# Patient Record
Sex: Female | Born: 1935 | Race: White | Hispanic: No | Smoking: Never smoker
Health system: Southern US, Community
[De-identification: ages and names within clinical notes are randomized; demographics above are authoritative.]

## PROBLEM LIST (undated history)

## (undated) DIAGNOSIS — G459 Transient cerebral ischemic attack, unspecified: Secondary | ICD-10-CM

## (undated) DIAGNOSIS — N179 Acute kidney failure, unspecified: Secondary | ICD-10-CM

## (undated) DIAGNOSIS — D649 Anemia, unspecified: Secondary | ICD-10-CM

## (undated) DIAGNOSIS — K559 Vascular disorder of intestine, unspecified: Secondary | ICD-10-CM

## (undated) DIAGNOSIS — N12 Tubulo-interstitial nephritis, not specified as acute or chronic: Secondary | ICD-10-CM

## (undated) DIAGNOSIS — N261 Atrophy of kidney (terminal): Secondary | ICD-10-CM

## (undated) DIAGNOSIS — Z8719 Personal history of other diseases of the digestive system: Secondary | ICD-10-CM

## (undated) DIAGNOSIS — N189 Chronic kidney disease, unspecified: Secondary | ICD-10-CM

## (undated) DIAGNOSIS — I2510 Atherosclerotic heart disease of native coronary artery without angina pectoris: Secondary | ICD-10-CM

## (undated) DIAGNOSIS — M1990 Unspecified osteoarthritis, unspecified site: Secondary | ICD-10-CM

## (undated) DIAGNOSIS — Z860100 Personal history of colon polyps, unspecified: Secondary | ICD-10-CM

## (undated) DIAGNOSIS — I7090 Unspecified atherosclerosis: Secondary | ICD-10-CM

## (undated) DIAGNOSIS — N2889 Other specified disorders of kidney and ureter: Secondary | ICD-10-CM

## (undated) DIAGNOSIS — I3139 Other pericardial effusion (noninflammatory): Secondary | ICD-10-CM

## (undated) DIAGNOSIS — E785 Hyperlipidemia, unspecified: Secondary | ICD-10-CM

## (undated) DIAGNOSIS — I471 Supraventricular tachycardia, unspecified: Secondary | ICD-10-CM

## (undated) DIAGNOSIS — K862 Cyst of pancreas: Secondary | ICD-10-CM

## (undated) DIAGNOSIS — K315 Obstruction of duodenum: Secondary | ICD-10-CM

## (undated) DIAGNOSIS — I10 Essential (primary) hypertension: Secondary | ICD-10-CM

## (undated) DIAGNOSIS — J9811 Atelectasis: Secondary | ICD-10-CM

## (undated) DIAGNOSIS — I6523 Occlusion and stenosis of bilateral carotid arteries: Secondary | ICD-10-CM

## (undated) DIAGNOSIS — I313 Pericardial effusion (noninflammatory): Secondary | ICD-10-CM

## (undated) DIAGNOSIS — L409 Psoriasis, unspecified: Secondary | ICD-10-CM

## (undated) DIAGNOSIS — I517 Cardiomegaly: Secondary | ICD-10-CM

## (undated) DIAGNOSIS — E039 Hypothyroidism, unspecified: Secondary | ICD-10-CM

## (undated) DIAGNOSIS — C4490 Unspecified malignant neoplasm of skin, unspecified: Secondary | ICD-10-CM

## (undated) DIAGNOSIS — N1330 Unspecified hydronephrosis: Secondary | ICD-10-CM

## (undated) DIAGNOSIS — G629 Polyneuropathy, unspecified: Secondary | ICD-10-CM

## (undated) DIAGNOSIS — G2581 Restless legs syndrome: Secondary | ICD-10-CM

## (undated) DIAGNOSIS — N289 Disorder of kidney and ureter, unspecified: Secondary | ICD-10-CM

## (undated) DIAGNOSIS — K5790 Diverticulosis of intestine, part unspecified, without perforation or abscess without bleeding: Secondary | ICD-10-CM

## (undated) DIAGNOSIS — C7491 Malignant neoplasm of unspecified part of right adrenal gland: Secondary | ICD-10-CM

## (undated) DIAGNOSIS — Z973 Presence of spectacles and contact lenses: Secondary | ICD-10-CM

## (undated) DIAGNOSIS — Z8639 Personal history of other endocrine, nutritional and metabolic disease: Secondary | ICD-10-CM

## (undated) DIAGNOSIS — I739 Peripheral vascular disease, unspecified: Secondary | ICD-10-CM

## (undated) DIAGNOSIS — J189 Pneumonia, unspecified organism: Secondary | ICD-10-CM

## (undated) HISTORY — PX: THYROIDECTOMY: SHX17

## (undated) HISTORY — PX: CHOLECYSTECTOMY: SHX55

## (undated) HISTORY — PX: URETERAL STENT PLACEMENT: SHX822

## (undated) HISTORY — PX: APPENDECTOMY: SHX54

## (undated) HISTORY — PX: ADRENALECTOMY: SHX876

## (undated) HISTORY — PX: COLONOSCOPY: SHX174

## (undated) HISTORY — PX: UPPER GI ENDOSCOPY: SHX6162

---

## 1973-05-16 HISTORY — PX: ABDOMINAL HYSTERECTOMY: SHX81

## 1994-05-16 DIAGNOSIS — C7491 Malignant neoplasm of unspecified part of right adrenal gland: Secondary | ICD-10-CM

## 1994-05-16 HISTORY — DX: Malignant neoplasm of unspecified part of right adrenal gland: C74.91

## 2014-12-22 ENCOUNTER — Telehealth: Payer: Self-pay | Admitting: Gastroenterology

## 2014-12-22 NOTE — Telephone Encounter (Signed)
Received records from [facility] and have placed on Dr. Ardis Hughs desk for review.

## 2014-12-30 ENCOUNTER — Telehealth: Payer: Self-pay | Admitting: Gastroenterology

## 2014-12-30 NOTE — Telephone Encounter (Signed)
Dr. Ardis Hughs declined patient on 12-26-14

## 2015-02-06 ENCOUNTER — Ambulatory Visit: Payer: Self-pay | Admitting: Family Medicine

## 2016-11-15 HISTORY — PX: OTHER SURGICAL HISTORY: SHX169

## 2017-04-12 HISTORY — PX: RENAL ARTERY STENT: SHX2321

## 2017-04-12 HISTORY — PX: OTHER SURGICAL HISTORY: SHX169

## 2017-05-11 DIAGNOSIS — K315 Obstruction of duodenum: Secondary | ICD-10-CM

## 2017-05-11 HISTORY — DX: Obstruction of duodenum: K31.5

## 2017-11-03 ENCOUNTER — Encounter (HOSPITAL_BASED_OUTPATIENT_CLINIC_OR_DEPARTMENT_OTHER): Payer: Self-pay | Admitting: *Deleted

## 2017-11-03 ENCOUNTER — Emergency Department (HOSPITAL_BASED_OUTPATIENT_CLINIC_OR_DEPARTMENT_OTHER): Payer: Medicare Other

## 2017-11-03 ENCOUNTER — Emergency Department (HOSPITAL_BASED_OUTPATIENT_CLINIC_OR_DEPARTMENT_OTHER)
Admission: EM | Admit: 2017-11-03 | Discharge: 2017-11-03 | Disposition: A | Discharge status: Home / Self Care | Payer: Medicare Other | Attending: Emergency Medicine | Admitting: Emergency Medicine

## 2017-11-03 ENCOUNTER — Other Ambulatory Visit: Payer: Self-pay

## 2017-11-03 DIAGNOSIS — R1011 Right upper quadrant pain: Secondary | ICD-10-CM

## 2017-11-03 DIAGNOSIS — Z8673 Personal history of transient ischemic attack (TIA), and cerebral infarction without residual deficits: Secondary | ICD-10-CM | POA: Diagnosis not present

## 2017-11-03 DIAGNOSIS — Z85858 Personal history of malignant neoplasm of other endocrine glands: Secondary | ICD-10-CM | POA: Diagnosis not present

## 2017-11-03 DIAGNOSIS — N189 Chronic kidney disease, unspecified: Secondary | ICD-10-CM | POA: Insufficient documentation

## 2017-11-03 DIAGNOSIS — I2510 Atherosclerotic heart disease of native coronary artery without angina pectoris: Secondary | ICD-10-CM | POA: Diagnosis not present

## 2017-11-03 DIAGNOSIS — Z79899 Other long term (current) drug therapy: Secondary | ICD-10-CM | POA: Insufficient documentation

## 2017-11-03 DIAGNOSIS — N3000 Acute cystitis without hematuria: Secondary | ICD-10-CM | POA: Diagnosis not present

## 2017-11-03 DIAGNOSIS — Z7982 Long term (current) use of aspirin: Secondary | ICD-10-CM | POA: Insufficient documentation

## 2017-11-03 DIAGNOSIS — Z7902 Long term (current) use of antithrombotics/antiplatelets: Secondary | ICD-10-CM | POA: Diagnosis not present

## 2017-11-03 DIAGNOSIS — I517 Cardiomegaly: Secondary | ICD-10-CM

## 2017-11-03 DIAGNOSIS — I129 Hypertensive chronic kidney disease with stage 1 through stage 4 chronic kidney disease, or unspecified chronic kidney disease: Secondary | ICD-10-CM | POA: Diagnosis not present

## 2017-11-03 DIAGNOSIS — K5900 Constipation, unspecified: Secondary | ICD-10-CM | POA: Diagnosis not present

## 2017-11-03 HISTORY — DX: Chronic kidney disease, unspecified: N18.9

## 2017-11-03 HISTORY — DX: Supraventricular tachycardia: I47.1

## 2017-11-03 HISTORY — DX: Vascular disorder of intestine, unspecified: K55.9

## 2017-11-03 HISTORY — DX: Restless legs syndrome: G25.81

## 2017-11-03 HISTORY — DX: Essential (primary) hypertension: I10

## 2017-11-03 HISTORY — DX: Supraventricular tachycardia, unspecified: I47.10

## 2017-11-03 HISTORY — DX: Polyneuropathy, unspecified: G62.9

## 2017-11-03 HISTORY — DX: Atherosclerotic heart disease of native coronary artery without angina pectoris: I25.10

## 2017-11-03 HISTORY — DX: Pericardial effusion (noninflammatory): I31.3

## 2017-11-03 HISTORY — DX: Malignant neoplasm of unspecified part of right adrenal gland: C74.91

## 2017-11-03 HISTORY — DX: Hyperlipidemia, unspecified: E78.5

## 2017-11-03 HISTORY — DX: Anemia, unspecified: D64.9

## 2017-11-03 HISTORY — DX: Disorder of kidney and ureter, unspecified: N28.9

## 2017-11-03 HISTORY — DX: Other pericardial effusion (noninflammatory): I31.39

## 2017-11-03 HISTORY — DX: Cardiomegaly: I51.7

## 2017-11-03 HISTORY — DX: Transient cerebral ischemic attack, unspecified: G45.9

## 2017-11-03 HISTORY — DX: Acute kidney failure, unspecified: N17.9

## 2017-11-03 LAB — TROPONIN I: Troponin I: <0.03 ng/mL (ref <0.03)

## 2017-11-03 LAB — URINALYSIS, MICROSCOPIC (REFLEX)

## 2017-11-03 LAB — URINALYSIS, ROUTINE W REFLEX MICROSCOPIC
Bilirubin Urine: NEGATIVE
GLUCOSE, UA: NEGATIVE mg/dL
Hgb urine dipstick: NEGATIVE
KETONES UR: NEGATIVE mg/dL
Nitrite: NEGATIVE
PH: 5.5 (ref 5.0–8.0)
Protein, ur: 30 mg/dL — AB
SPECIFIC GRAVITY, URINE: 1.02 (ref 1.005–1.030)

## 2017-11-03 LAB — COMPREHENSIVE METABOLIC PANEL
ALBUMIN: 3.8 g/dL (ref 3.5–5.0)
ALT: 14 U/L (ref 14–54)
AST: 20 U/L (ref 15–41)
Alkaline Phosphatase: 61 U/L (ref 38–126)
Anion gap: 8 (ref 5–15)
BUN: 32 mg/dL — ABNORMAL HIGH (ref 6–20)
CHLORIDE: 109 mmol/L (ref 101–111)
CO2: 21 mmol/L — ABNORMAL LOW (ref 22–32)
CREATININE: 1.23 mg/dL — AB (ref 0.44–1.00)
Calcium: 8.8 mg/dL — ABNORMAL LOW (ref 8.9–10.3)
GFR calc Af Amer: 45 mL/min — ABNORMAL LOW (ref >60)
GFR, EST NON AFRICAN AMERICAN: 39 mL/min — AB (ref >60)
Glucose, Bld: 126 mg/dL — ABNORMAL HIGH (ref 65–99)
Potassium: 4.5 mmol/L (ref 3.5–5.1)
Sodium: 138 mmol/L (ref 135–145)
Total Bilirubin: 0.5 mg/dL (ref 0.3–1.2)
Total Protein: 7 g/dL (ref 6.5–8.1)

## 2017-11-03 LAB — LIPASE, BLOOD: LIPASE: 21 U/L (ref 11–51)

## 2017-11-03 LAB — CBC WITH DIFFERENTIAL/PLATELET
BASOS ABS: 0 10*3/uL (ref 0.0–0.1)
Basophils Relative: 0 %
EOS PCT: 0 %
Eosinophils Absolute: 0 10*3/uL (ref 0.0–0.7)
HEMATOCRIT: 30.9 % — AB (ref 36.0–46.0)
Hemoglobin: 10.3 g/dL — ABNORMAL LOW (ref 12.0–15.0)
LYMPHS PCT: 11 %
Lymphs Abs: 1.2 10*3/uL (ref 0.7–4.0)
MCH: 32.4 pg (ref 26.0–34.0)
MCHC: 33.3 g/dL (ref 30.0–36.0)
MCV: 97.2 fL (ref 78.0–100.0)
Monocytes Absolute: 1.4 10*3/uL — ABNORMAL HIGH (ref 0.1–1.0)
Monocytes Relative: 12 %
NEUTROS ABS: 8.7 10*3/uL — AB (ref 1.7–7.7)
Neutrophils Relative %: 77 %
PLATELETS: 132 10*3/uL — AB (ref 150–400)
RBC: 3.18 MIL/uL — AB (ref 3.87–5.11)
RDW: 13.2 % (ref 11.5–15.5)
WBC: 11.3 10*3/uL — AB (ref 4.0–10.5)

## 2017-11-03 LAB — I-STAT CG4 LACTIC ACID, ED: Lactic Acid, Venous: 1.05 mmol/L (ref 0.5–1.9)

## 2017-11-03 MED ORDER — CEPHALEXIN 500 MG PO CAPS: 500.0000 mg | ORAL_CAPSULE | Freq: Four times a day (QID) | ORAL | 0 refills | Status: AC

## 2017-11-03 MED ORDER — IOPAMIDOL (ISOVUE-300) INJECTION 61%
100.0000 mL | Freq: Once | INTRAVENOUS | Status: AC | PRN
  Administered 2017-11-03: 75 mL via INTRAVENOUS

## 2017-11-03 MED ORDER — ONDANSETRON HCL 4 MG/2ML IJ SOLN
4.0000 mg | Freq: Once | INTRAMUSCULAR | Status: AC
  Administered 2017-11-03: 4 mg via INTRAVENOUS
  Filled 2017-11-03: qty 2

## 2017-11-03 MED ORDER — POLYETHYLENE GLYCOL 3350 17 GM/SCOOP PO POWD: 1.0000 | Freq: Once | ORAL | 0 refills | Status: AC

## 2017-11-03 MED ORDER — MORPHINE SULFATE (PF) 4 MG/ML IV SOLN
4.0000 mg | Freq: Once | INTRAVENOUS | Status: AC
  Administered 2017-11-03: 4 mg via INTRAVENOUS
  Filled 2017-11-03: qty 1

## 2017-11-03 MED ORDER — SODIUM CHLORIDE 0.9 % IV SOLN
1.0000 g | Freq: Once | INTRAVENOUS | Status: AC
  Administered 2017-11-03: 1 g via INTRAVENOUS
  Filled 2017-11-03: qty 10

## 2017-11-03 MED FILL — SM CLEARLAX POWDER: 14 days supply | Qty: 238 | Fill #0

## 2017-11-03 MED FILL — CEPHALEXIN 500 MG CAPSULE: 500 | 10 days supply | Qty: 40 | Fill #0

## 2017-11-03 NOTE — Discharge Instructions (Signed)
Take antibiotics as prescribed. Take the entire course, even if your symptoms improve.  Make sure you are staying well-hydrated with water.  Your urine should be clear to pale yellow. Use Metamucil or MiraLAX for a cleanout and then 1-2 capfuls at night until you are having regular bowel movements.  You may continue your stool softener as well. It is important that you follow-up with urology (Dr. Renard Matter) for further evaluation of your symptoms and to recheck your labs. Return to the emergency room if you develop fevers, worsening pain, vomiting, inability to urinate, or any new symptoms.  You should have a very low threshold as to when to come back to the ER, as you are at much higher risk.

## 2017-11-03 NOTE — ED Provider Notes (Signed)
[facility] EMERGENCY DEPARTMENT Provider Note   CSN: [id number] Arrival date & time: 11/03/17  6599     History   Chief Complaint Chief Complaint  Patient presents with  . Abdominal Pain    HPI Brooke Walker is a 84 y.o. female presenting for evaluation of abdominal pain.  Patient states that she had acute onset abdominal pain that woke her up from sleep yesterday morning.  Since then, she has had constant right upper quadrant abdominal pain.  She was out of town at the time, which is why she waited until today to come in for evaluation.  She reports pain is severe constant, and nonradiating.  It is worse when she is lying flat, improved when she is sitting up.  She was taking ibuprofen yesterday which mildly improved the pain.  She reports nausea without vomiting.  She denies fevers, states she is feeling chilled.  She has been tolerating p.o. without difficulty and without change in the pain.  Reports normal urination without dysuria, hematuria, or worsening pain.  She has not had a bowel movement for 2 days, states this is normal.  Her last bowel movement was normal without blood or diarrhea.   Patient states her abdomen feels slightly more distended than normal. She has a significant medical history including mesenteric anemia with stent placement, pancreatic cyst, nonfunctioning right kidney, mass in the left kidney, adrenal cancer.  She has had a cholecystectomy and appendectomy.  She states she has never had pain similar to this before.  She ate cereal and a peach for breakfast this morning prior to going to urgent care.  He denies chest pain or shortness of breath, but states when she takes a deep breath then, it causes pain of the abdomen. Pt is on clopidogrel and ASA.  HPI  Past Medical History:  Diagnosis Date  . Adrenal cancer, right ([place])   . AKI (acute kidney injury) ([place])   . Anemia   . CKD (chronic kidney disease)   . Coronary artery disease   . Hyperlipidemia     . Hypertension   . Mesenteric ischemia ([place])   . Pancreatic cyst   . Pericardial effusion   . Peripheral polyneuropathy   . Renal insufficiency   . Restless leg syndrome   . SVT (supraventricular tachycardia) ([place])   . Thyroid disease   . Transient ischemic attack (TIA)     There are no active problems to display for this patient.   History reviewed. No pertinent surgical history.   OB History   None      Home Medications    Prior to Admission medications   Medication Sig Start Date End Date Taking? Authorizing Provider  amLODipine (NORVASC) 10 MG tablet Take by mouth. 08/01/12 10/02/18 Yes [provider]  clopidogrel (PLAVIX) 75 MG tablet TAKE 1 TABLET BY MOUTH EVERY DAY 08/04/17  Yes [provider]  estradiol (ESTRACE) 0.1 MG/GM vaginal cream Apply a pea-sized amount within vagina 2-3 times per week 08/04/16  Yes [provider]  hydrochlorothiazide (HYDRODIURIL) 25 MG tablet Take by mouth. 03/24/17  Yes [provider]  pravastatin (PRAVACHOL) 40 MG tablet TAKE 1 TABLET BY MOUTH EVERY DAY AT NIGHT 08/15/12  Yes [provider]  rOPINIRole (REQUIP) 0.25 MG tablet TAKE 2 TO 3 TABLETS BY MOUTH AT BEDTIME 08/15/17  Yes [provider]  aspirin EC 81 MG tablet Take by mouth.    [provider]  cephALEXin (KEFLEX) 500 MG capsule Take 1  capsule (500 mg total) by mouth 4 (four) times daily for 10 days. 11/03/17 11/13/17  Jaylyn Iyer, PA-C  diphenhydrAMINE (BENADRYL) 25 mg capsule Take by mouth.    [provider]  hydrALAZINE (APRESOLINE) 100 MG tablet Take 100 mg by mouth 3 (three) times daily. 10/14/17   [provider]  labetalol (NORMODYNE) 200 MG tablet TAKE 1/2 TABLET BY MOUTH 2 TIMES DAILY 10/02/17   [provider]  levothyroxine (SYNTHROID, LEVOTHROID) 112 MCG tablet TAKE 1 TABLET (112 MCG TOTAL) BY MOUTH DAILY AT 6AM 09/18/17   [provider]  polyethylene glycol powder (MIRALAX)  powder Take 255 g by mouth once for 1 dose. 11/03/17 11/03/17  Romulo Okray, PA-C  telmisartan (MICARDIS) 40 MG tablet Take 40 mg by mouth daily. 10/20/17   [provider]    Family History No family history on file.  Social History Social History   Tobacco Use  . Smoking status: Never Smoker  . Smokeless tobacco: Never Used  Substance Use Topics  . Alcohol use: Not on file  . Drug use: Not on file     Allergies   Amoxicillin; Clonidine derivatives; Metoprolol; and Sulfa antibiotics   Review of Systems Review of Systems  Constitutional: Positive for chills.  Gastrointestinal: Positive for abdominal pain, constipation (baseline) and nausea.  Hematological: Bruises/bleeds easily (plavix and asa).  All other systems reviewed and are negative.    Physical Exam Updated Vital Signs BP (!) 180/49 Comment: md is aware of sat, now 91% on room air.  Pulse 83 Comment: md is aware of sat, now 91% on room air.  Temp 99 F (37.2 C) (Rectal)   Resp 17 Comment: md is aware of sat, now 91% on room air.  Ht 5\' 4"  (1.626 m)   Wt 55.8 kg (123 lb)   SpO2 (!) 84% Comment: md is aware of sat, now 91% on room air.  BMI 21.11 kg/m   Physical Exam  Constitutional: She is oriented to person, place, and time. She appears well-developed and well-nourished. No distress.  Elderly female who appears uncomfortable but in no distress.  HENT:  Head: Normocephalic and atraumatic.  Eyes: Pupils are equal, round, and reactive to light. Conjunctivae and EOM are normal.  Neck: Normal range of motion. Neck supple.  Cardiovascular: Normal rate, regular rhythm and intact distal pulses.  Pulmonary/Chest: Effort normal and breath sounds normal. No respiratory distress. She has no wheezes.  Abdominal: Soft. Bowel sounds are normal. She exhibits abdominal bruit and mass. There is generalized tenderness and tenderness in the right upper quadrant. There is no rigidity, no rebound, no guarding, no CVA  tenderness and negative Murphy's sign.  Normal bowel sounds.  Bruits heard in multiple abdominal quadrants.  Tenderness to palpation of generalized abdomen, when pressing on the left side causes pain on the right upper quadrant. ?mass in RUQ/edpigastric abd.    Musculoskeletal: Normal range of motion.  Neurological: She is alert and oriented to person, place, and time.  Skin: Skin is warm and dry.  Psychiatric: She has a normal mood and affect.  Nursing note and vitals reviewed.    ED Treatments / Results  Labs (all labs ordered are listed, but only abnormal results are displayed) Labs Reviewed  CBC WITH DIFFERENTIAL/PLATELET - Abnormal; Notable for the following components:      Result Value   WBC 11.3 (*)    RBC 3.18 (*)    Hemoglobin 10.3 (*)    HCT 30.9 (*)  Platelets 132 (*)    Neutro Abs 8.7 (*)    Monocytes Absolute 1.4 (*)    All other components within normal limits  COMPREHENSIVE METABOLIC PANEL - Abnormal; Notable for the following components:   CO2 21 (*)    Glucose, Bld 126 (*)    BUN 32 (*)    Creatinine, Ser 1.23 (*)    Calcium 8.8 (*)    GFR calc non Af Amer 39 (*)    GFR calc Af Amer 45 (*)    All other components within normal limits  URINALYSIS, ROUTINE W REFLEX MICROSCOPIC - Abnormal; Notable for the following components:   APPearance CLOUDY (*)    Protein, ur 30 (*)    Leukocytes, UA LARGE (*)    All other components within normal limits  URINALYSIS, MICROSCOPIC (REFLEX) - Abnormal; Notable for the following components:   Bacteria, UA MANY (*)    All other components within normal limits  URINE CULTURE  LIPASE, BLOOD  TROPONIN I  I-STAT CG4 LACTIC ACID, ED  I-STAT CG4 LACTIC ACID, ED    EKG EKG Interpretation  Date/Time:  Friday November 03 2017 11:02:49 EDT Ventricular Rate:  81 PR Interval:    QRS Duration: 82 QT Interval:  360 QTC Calculation: 418 R Axis:   75 Text Interpretation:  Sinus rhythm Atrial premature complex Baseline wander in  lead(s) V6 No STEMI.  Confirmed by Nanda Quinton [phone]) on 11/03/2017 11:05:40 AM   Radiology Dg Chest 2 View  Result Date: 11/03/2017 CLINICAL DATA:  Right-sided abdominal pain. EXAM: CHEST - 2 VIEW COMPARISON:  No prior. FINDINGS: Mediastinum and hilar structures normal. Cardiomegaly with normal pulmonary vascularity. No focal infiltrate. Small left pleural effusion versus pleural scarring. Biapical pleural thickening noted most consistent scarring. Surgical clips upper abdomen. IMPRESSION: Cardiomegaly.  Small left pleural effusion versus pleural scarring. Electronically Signed   By: Marcello Moores  Register   On: 11/03/2017 12:06   Ct Abdomen Pelvis W Contrast  Result Date: 11/03/2017 CLINICAL DATA:  Nausea, vomiting, generalized acute abdominal pain, leukocytosis, history RIGHT adrenal gland cancer, chronic kidney disease, coronary artery disease EXAM: CT ABDOMEN AND PELVIS WITH CONTRAST TECHNIQUE: Multidetector CT imaging of the abdomen and pelvis was performed using the standard protocol following bolus administration of intravenous contrast. Sagittal and coronal MPR images reconstructed from axial data set. CONTRAST:  49mL ISOVUE-300 IOPAMIDOL (ISOVUE-300) INJECTION 61% IV. No oral contrast. COMPARISON:  None FINDINGS: Lower chest: Mild bibasilar atelectasis Hepatobiliary: Gallbladder surgically absent. Mild intrahepatic and extrahepatic biliary dilatation with CBD measuring 10 mm proximally and 8 mm distally. Pancreas: Atrophic pancreas. Cystic lesion at pancreatic tail 2.5 x 2.4 x 2.5 cm. Spleen: Normal appearance.  Small splenule at splenic hilum Adrenals/Urinary Tract: LEFT adrenal gland unremarkable. Prior resection of LEFT adrenal gland. LEFT kidney normal size with small LEFT renal cyst. Atrophic RIGHT kidney with significant hydronephrosis and minimal proximal hydroureter though the majority of the RIGHT ureters normal caliber, question proximal ureteral obstruction. No definite urinary tract  calcification is seen. Mild RIGHT perinephric edema. Single focus of air within urinary bladder question prior catheterization. Stomach/Bowel: Stomach unremarkable. Prominent stool RIGHT colon. Sigmoid diverticulosis. Remaining bowel loops unremarkable. Vascular/Lymphatic: Extensive atherosclerotic calcifications of aorta, iliac arteries and visceral artery origins. Dense calcification at origins of celiac artery, SMA and LEFT renal artery. Probable high-grade narrowings of the abdominal aorta and iliac arteries as well as proximal visceral arteries. Reproductive: Uterus surgically absent. Nonvisualization of ovaries. Other: No free air or free fluid.  No hernia.  Musculoskeletal: Diffuse osseous demineralization. Degenerative changes and scoliosis of lumbar spine. IMPRESSION: Atrophic RIGHT kidney with RIGHT hydronephrosis and proximal RIGHT hydroureter question proximal ureteral obstruction; no definite ureteral calcification is seen. Intrahepatic and extrahepatic biliary dilatation post cholecystectomy recommend correlation with LFTs. Extensive atherosclerotic calcifications with expected significant narrowings of the abdominal aorta, iliac arteries, celiac artery, SMA and LEFT renal artery. Cystic lesion at pancreatic tail 2.5 x 2.5 x 2.4 cm; if clinically indicated based on patient age and clinical status, would recommend follow-up MR imaging in 6 months to characterize and assess stability. Electronically Signed   By: Lavonia Dana M.D.   On: 11/03/2017 12:13    Procedures Procedures (including critical care time)  Medications Ordered in ED Medications  ondansetron (ZOFRAN) injection 4 mg (4 mg Intravenous Given 11/03/17 1043)  morphine 4 MG/ML injection 4 mg (4 mg Intravenous Given 11/03/17 1056)  iopamidol (ISOVUE-300) 61 % injection 100 mL (75 mLs Intravenous Contrast Given 11/03/17 1140)  cefTRIAXone (ROCEPHIN) 1 g in sodium chloride 0.9 % 100 mL IVPB (0 g Intravenous Stopped 11/03/17 1413)      Initial Impression / Assessment and Plan / ED Course  I have reviewed the triage vital signs and the nursing notes.  Pertinent labs & imaging results that were available during my care of the patient were reviewed by me and considered in my medical decision making (see chart for details).     Pt presenting for evaluation of acute onset abdominal pain.  Physical exam concerning, patient with significant intra-abdominal pathology coming in with new and worsening right upper quadrant abdominal pain.  Patient appears uncomfortable, but in no distress.  She is afebrile not tachycardic.  Generalized tenderness palpation of the abdomen, possible mass of the right upper quadrant.  Will obtain labs, lactic, urine, and CT.  Morphine and Zofran for symptom control.  Case discussed with attending, Dr. Laverta Baltimore evaluated the patient.  On reassessment, patient reports she is feeling much more comfortable.  Labs show mild leukocytosis at 11.3.  Urine shows infection with many bacteria and large leuks.  Lactic troponin negative.  Creatinine similar to baseline (1.23 vs 1.1).  Will give dose of Rocephin.  CXR viewed and interpreted by me, no pna, pnx, or effusions. ekg without stemi. CT pending.  CT shows atrophic R kidney (baseline) with R hydroureternephrosis, concerning for possible obstruction.  Vasculature appears consistent with chronic mesenteric ischemia, no obvious acute findings. Pancreatic cyst stable. Will consult with nephrology.   Discussed with nephrology from [facility], who does not seen any immediate need for admission or further work up.  Recommends follow-up with urology.  Discussed with patient.  Recommended admission for serial exams, bowel cleanout, and reassessment of symptoms.  Patient states that she is staying at a nursing home that has a doctor's nurses on staff, would like to go home.  Discussed with patient that she is at higher risk for complication.  Patient states she understands,  but would still like to go home.  She is agreeable to return if anything changes or worsens.  Patient is agreeable to follow-up with urology for further evaluation and to recheck labs.  On reexamination, patient remains comfortable and pain is controlled.  Discussed with patient diagnosis of UTI and constipation. Will d/c with abx and miralax. At this time, pt appears safe for d/c. Return precautions given. Pt states she understands and agrees to plan.    Final Clinical Impressions(s) / ED Diagnoses   Final diagnoses:  Abdominal pain, acute,  right upper quadrant  Constipation, unspecified constipation type  Acute cystitis without hematuria    ED Discharge Orders        Ordered    cephALEXin (KEFLEX) 500 MG capsule  4 times daily     11/03/17 1433    polyethylene glycol powder (MIRALAX) powder   Once     11/03/17 1433       Makailee Nudelman, PA-C 11/03/17 1753    Long, Wonda Olds, MD 11/03/17 [phone]

## 2017-11-03 NOTE — ED Triage Notes (Signed)
Pt reports right mid abd pain along with nausea, no emesis x last night. Last bm 2 days ago, states this is her normal bowel pattern. Denies any fevers, went to urgent care and was sent here.

## 2017-11-06 LAB — URINE CULTURE

## 2017-11-07 ENCOUNTER — Telehealth: Payer: Self-pay | Admitting: Emergency Medicine

## 2017-11-07 NOTE — Telephone Encounter (Signed)
Post ED Visit - Positive Culture Follow-up  Culture report reviewed by antimicrobial stewardship pharmacist:  []  Elenor Quinones, Pharm.D. []  Heide Guile, Pharm.D., BCPS AQ-ID []  Parks Neptune, Pharm.D., BCPS []  Alycia Rossetti, Pharm.D., BCPS []  [place], [place].D., BCPS, AAHIVP []  Legrand Como, Pharm.D., BCPS, AAHIVP []  Salome Arnt, PharmD, BCPS []  Wynell Balloon, PharmD []  Vincenza Hews, PharmD, BCPS Maxcine Ham PharmD  Positive urine culture Treated with cephalexin, organism sensitive to the same and no further patient follow-up is required at this time.  Hazle Nordmann 11/07/2017, 12:28 PM

## 2017-11-15 ENCOUNTER — Other Ambulatory Visit: Payer: Self-pay

## 2017-11-15 ENCOUNTER — Encounter (HOSPITAL_BASED_OUTPATIENT_CLINIC_OR_DEPARTMENT_OTHER): Payer: Self-pay

## 2017-11-15 ENCOUNTER — Emergency Department (HOSPITAL_BASED_OUTPATIENT_CLINIC_OR_DEPARTMENT_OTHER)
Admission: EM | Admit: 2017-11-15 | Discharge: 2017-11-15 | Disposition: A | Discharge status: Home / Self Care | Payer: Medicare Other | Attending: Emergency Medicine | Admitting: Emergency Medicine

## 2017-11-15 DIAGNOSIS — Z7982 Long term (current) use of aspirin: Secondary | ICD-10-CM | POA: Diagnosis not present

## 2017-11-15 DIAGNOSIS — I2510 Atherosclerotic heart disease of native coronary artery without angina pectoris: Secondary | ICD-10-CM | POA: Insufficient documentation

## 2017-11-15 DIAGNOSIS — E079 Disorder of thyroid, unspecified: Secondary | ICD-10-CM | POA: Diagnosis not present

## 2017-11-15 DIAGNOSIS — N189 Chronic kidney disease, unspecified: Secondary | ICD-10-CM | POA: Diagnosis not present

## 2017-11-15 DIAGNOSIS — Z8673 Personal history of transient ischemic attack (TIA), and cerebral infarction without residual deficits: Secondary | ICD-10-CM | POA: Diagnosis not present

## 2017-11-15 DIAGNOSIS — R21 Rash and other nonspecific skin eruption: Secondary | ICD-10-CM | POA: Insufficient documentation

## 2017-11-15 DIAGNOSIS — Z79899 Other long term (current) drug therapy: Secondary | ICD-10-CM | POA: Diagnosis not present

## 2017-11-15 DIAGNOSIS — I129 Hypertensive chronic kidney disease with stage 1 through stage 4 chronic kidney disease, or unspecified chronic kidney disease: Secondary | ICD-10-CM | POA: Diagnosis not present

## 2017-11-15 DIAGNOSIS — Z7902 Long term (current) use of antithrombotics/antiplatelets: Secondary | ICD-10-CM | POA: Diagnosis not present

## 2017-11-15 MED ORDER — KETOCONAZOLE 2 % EX SHAM: 1.0000 "application " | MEDICATED_SHAMPOO | CUTANEOUS | 0 refills | Status: AC

## 2017-11-15 MED FILL — KETOCONAZOLE 2% SHAMPOO: 2 | 14 days supply | Qty: 120 | Fill #0

## 2017-11-15 NOTE — ED Provider Notes (Signed)
[facility] EMERGENCY DEPARTMENT Provider Note   CSN: [id number] Arrival date & time: 11/15/17  1158     History   Chief Complaint Chief Complaint  Patient presents with  . Rash    HPI Brooke Walker is a 84 y.o. female.  The history is provided by the patient. No language interpreter was used.  Rash     Brooke Walker is a 84 y.o. female who presents to the Emergency Department complaining of rash. Four weeks ago she developed an itchy rash to the back of her left ear. She saw her dermatologist three weeks ago and was started on mupirocin ointment. Over the last three weeks her rash has progressed and now she has itching throughout the left side of her scalp. There is no pain, fevers or visual changes. She saw her hairstylist today who noted hair loss and she presents to the emergency department for further evaluation. Past Medical History:  Diagnosis Date  . Adrenal cancer, right ([place])   . AKI (acute kidney injury) ([place])   . Anemia   . CKD (chronic kidney disease)   . Coronary artery disease   . Hyperlipidemia   . Hypertension   . Mesenteric ischemia ([place])   . Pancreatic cyst   . Pericardial effusion   . Peripheral polyneuropathy   . Renal insufficiency   . Restless leg syndrome   . SVT (supraventricular tachycardia) ([place])   . Thyroid disease   . Transient ischemic attack (TIA)     There are no active problems to display for this patient.   History reviewed. No pertinent surgical history.   OB History   None      Home Medications    Prior to Admission medications   Medication Sig Start Date End Date Taking? Authorizing Provider  amLODipine (NORVASC) 10 MG tablet Take by mouth. 08/01/12 10/02/18  [provider]  aspirin EC 81 MG tablet Take by mouth.    [provider]  clopidogrel (PLAVIX) 75 MG tablet TAKE 1 TABLET BY MOUTH EVERY DAY 08/04/17   [provider]  diphenhydrAMINE (BENADRYL) 25 mg capsule Take by mouth.     [provider]  estradiol (ESTRACE) 0.1 MG/GM vaginal cream Apply a pea-sized amount within vagina 2-3 times per week 08/04/16   [provider]  hydrALAZINE (APRESOLINE) 100 MG tablet Take 100 mg by mouth 3 (three) times daily. 10/14/17   [provider]  hydrochlorothiazide (HYDRODIURIL) 25 MG tablet Take by mouth. 03/24/17   [provider]  ketoconazole (NIZORAL) 2 % shampoo Apply 1 application topically 3 (three) times a week. Use three times weekly for two weeks. 11/15/17   Quintella Reichert, MD  labetalol (NORMODYNE) 200 MG tablet TAKE 1/2 TABLET BY MOUTH 2 TIMES DAILY 10/02/17   [provider]  levothyroxine (SYNTHROID, LEVOTHROID) 112 MCG tablet TAKE 1 TABLET (112 MCG TOTAL) BY MOUTH DAILY AT 6AM 09/18/17   [provider]  pravastatin (PRAVACHOL) 40 MG tablet TAKE 1 TABLET BY MOUTH EVERY DAY AT NIGHT 08/15/12   [provider]  rOPINIRole (REQUIP) 0.25 MG tablet TAKE 2 TO 3 TABLETS BY MOUTH AT BEDTIME 08/15/17   [provider]  telmisartan (MICARDIS) 40 MG tablet Take 40 mg by mouth daily. 10/20/17   [provider]    Family History No family history on file.  Social History Social History   Tobacco Use  . Smoking status: Never Smoker  . Smokeless tobacco: Never Used  Substance Use Topics  .  Alcohol use: Never    Frequency: Never  . Drug use: Never     Allergies   Amoxicillin; Clonidine derivatives; Metoprolol; and Sulfa antibiotics   Review of Systems Review of Systems  Skin: Positive for rash.  All other systems reviewed and are negative.    Physical Exam Updated Vital Signs BP (!) 193/49 (BP Location: Left Arm)   Pulse 82   Temp 98 F (36.7 C) (Oral)   Resp 18   Ht 5\' 4"  (1.626 m)   Wt 55.1 kg (121 lb 7.6 oz)   SpO2 98%   BMI 20.85 kg/m   Physical Exam  Constitutional: She is oriented to person, place, and time. She appears well-developed and well-nourished.  HENT:  Head:  Normocephalic and atraumatic.  Right TM obscured by cerumen. Left TM clear. Pupils equal round and reactive. EOM I. There is a rash over the left scalp and behind the left ear. Rash consists of multiple large plaques that are ovoid in shape with a pink center, with central hair loss. Surrounding the plaques are areas of scaling and flaking. There is one plaque posterior to the left ear and one plaque on the left forehead.  Cardiovascular: Regular rhythm.  Pulmonary/Chest: Effort normal. No respiratory distress.  Musculoskeletal: She exhibits no edema or tenderness.  Neurological: She is alert and oriented to person, place, and time.  Skin: Skin is warm and dry.  Psychiatric: She has a normal mood and affect. Her behavior is normal.  Nursing note and vitals reviewed.    ED Treatments / Results  Labs (all labs ordered are listed, but only abnormal results are displayed) Labs Reviewed - No data to display  EKG None  Radiology No results found.  Procedures Procedures (including critical care time)  Medications Ordered in ED Medications - No data to display   Initial Impression / Assessment and Plan / ED Course  I have reviewed the triage vital signs and the nursing notes.  Pertinent labs & imaging results that were available during my care of the patient were reviewed by me and considered in my medical decision making (see chart for details).     Patient here for evaluation of four weeks of itchy rash to scalp. She does have multiple plaques, no evidence of zoster. No evidence of impetigo, cellulitis, abscess. Question possible tinea capitus or fungal scalp infection versus seborrheic dermatitis versus autoimmune condition. Will treat with ketoconazole shampoo with outpatient dermatology follow-up and return precautions.  Final Clinical Impressions(s) / ED Diagnoses   Final diagnoses:  Rash    ED Discharge Orders        Ordered    ketoconazole (NIZORAL) 2 % shampoo  3 times  weekly     11/15/17 1405       Quintella Reichert, MD 11/15/17 [phone]

## 2017-11-15 NOTE — Discharge Instructions (Signed)
You may have a fungal infection on your scalp called tinea capitus. Please use the shampoo as prescribed. Follow-up with your dermatologist and one week. Get rechecked immediately if you develop severe pain, fevers, vision changes or new concerning symptoms. You may take Benadryl at home as needed for itching according to label instructions.

## 2017-11-15 NOTE — ED Notes (Signed)
ED Provider at bedside. 

## 2017-11-15 NOTE — ED Triage Notes (Signed)
C/o rash that started behind left ear and now to scalp-was dermatologist 6/22-rx ointment-unsure of dx-NAD-steady gait

## 2017-12-14 DIAGNOSIS — J9811 Atelectasis: Secondary | ICD-10-CM

## 2017-12-14 HISTORY — DX: Atelectasis: J98.11

## 2017-12-20 ENCOUNTER — Inpatient Hospital Stay (HOSPITAL_BASED_OUTPATIENT_CLINIC_OR_DEPARTMENT_OTHER)
Admission: EM | Admit: 2017-12-20 | Discharge: 2017-12-25 | DRG: 659 | Disposition: A | Discharge status: Skilled Nursing Facility | Payer: Medicare Other | Attending: Internal Medicine | Admitting: Internal Medicine

## 2017-12-20 ENCOUNTER — Other Ambulatory Visit: Payer: Self-pay

## 2017-12-20 ENCOUNTER — Emergency Department (HOSPITAL_BASED_OUTPATIENT_CLINIC_OR_DEPARTMENT_OTHER): Payer: Medicare Other

## 2017-12-20 ENCOUNTER — Encounter (HOSPITAL_BASED_OUTPATIENT_CLINIC_OR_DEPARTMENT_OTHER): Payer: Self-pay | Admitting: Emergency Medicine

## 2017-12-20 DIAGNOSIS — R109 Unspecified abdominal pain: Secondary | ICD-10-CM | POA: Diagnosis not present

## 2017-12-20 DIAGNOSIS — N2889 Other specified disorders of kidney and ureter: Secondary | ICD-10-CM

## 2017-12-20 DIAGNOSIS — Z85858 Personal history of malignant neoplasm of other endocrine glands: Secondary | ICD-10-CM

## 2017-12-20 DIAGNOSIS — E039 Hypothyroidism, unspecified: Secondary | ICD-10-CM | POA: Diagnosis present

## 2017-12-20 DIAGNOSIS — I2510 Atherosclerotic heart disease of native coronary artery without angina pectoris: Secondary | ICD-10-CM | POA: Diagnosis not present

## 2017-12-20 DIAGNOSIS — Z7902 Long term (current) use of antithrombotics/antiplatelets: Secondary | ICD-10-CM

## 2017-12-20 DIAGNOSIS — K862 Cyst of pancreas: Secondary | ICD-10-CM | POA: Diagnosis present

## 2017-12-20 DIAGNOSIS — D509 Iron deficiency anemia, unspecified: Secondary | ICD-10-CM | POA: Diagnosis not present

## 2017-12-20 DIAGNOSIS — I161 Hypertensive emergency: Secondary | ICD-10-CM | POA: Diagnosis present

## 2017-12-20 DIAGNOSIS — Z66 Do not resuscitate: Secondary | ICD-10-CM | POA: Diagnosis not present

## 2017-12-20 DIAGNOSIS — I959 Hypotension, unspecified: Secondary | ICD-10-CM | POA: Diagnosis not present

## 2017-12-20 DIAGNOSIS — N136 Pyonephrosis: Principal | ICD-10-CM | POA: Diagnosis present

## 2017-12-20 DIAGNOSIS — D696 Thrombocytopenia, unspecified: Secondary | ICD-10-CM | POA: Diagnosis not present

## 2017-12-20 DIAGNOSIS — E875 Hyperkalemia: Secondary | ICD-10-CM | POA: Diagnosis present

## 2017-12-20 DIAGNOSIS — E785 Hyperlipidemia, unspecified: Secondary | ICD-10-CM | POA: Diagnosis not present

## 2017-12-20 DIAGNOSIS — Z87440 Personal history of urinary (tract) infections: Secondary | ICD-10-CM

## 2017-12-20 DIAGNOSIS — Z7982 Long term (current) use of aspirin: Secondary | ICD-10-CM

## 2017-12-20 DIAGNOSIS — G2581 Restless legs syndrome: Secondary | ICD-10-CM | POA: Diagnosis present

## 2017-12-20 DIAGNOSIS — Z8673 Personal history of transient ischemic attack (TIA), and cerebral infarction without residual deficits: Secondary | ICD-10-CM

## 2017-12-20 DIAGNOSIS — G629 Polyneuropathy, unspecified: Secondary | ICD-10-CM | POA: Diagnosis present

## 2017-12-20 DIAGNOSIS — J9601 Acute respiratory failure with hypoxia: Secondary | ICD-10-CM | POA: Diagnosis not present

## 2017-12-20 DIAGNOSIS — L409 Psoriasis, unspecified: Secondary | ICD-10-CM | POA: Diagnosis not present

## 2017-12-20 DIAGNOSIS — I1310 Hypertensive heart and chronic kidney disease without heart failure, with stage 1 through stage 4 chronic kidney disease, or unspecified chronic kidney disease: Secondary | ICD-10-CM | POA: Diagnosis not present

## 2017-12-20 DIAGNOSIS — Z882 Allergy status to sulfonamides status: Secondary | ICD-10-CM

## 2017-12-20 DIAGNOSIS — Z90710 Acquired absence of both cervix and uterus: Secondary | ICD-10-CM

## 2017-12-20 DIAGNOSIS — R739 Hyperglycemia, unspecified: Secondary | ICD-10-CM | POA: Diagnosis not present

## 2017-12-20 DIAGNOSIS — N12 Tubulo-interstitial nephritis, not specified as acute or chronic: Secondary | ICD-10-CM | POA: Diagnosis present

## 2017-12-20 DIAGNOSIS — I313 Pericardial effusion (noninflammatory): Secondary | ICD-10-CM | POA: Diagnosis present

## 2017-12-20 DIAGNOSIS — I499 Cardiac arrhythmia, unspecified: Secondary | ICD-10-CM | POA: Diagnosis present

## 2017-12-20 DIAGNOSIS — J811 Chronic pulmonary edema: Secondary | ICD-10-CM | POA: Diagnosis present

## 2017-12-20 DIAGNOSIS — N1330 Unspecified hydronephrosis: Secondary | ICD-10-CM | POA: Diagnosis present

## 2017-12-20 DIAGNOSIS — Z888 Allergy status to other drugs, medicaments and biological substances status: Secondary | ICD-10-CM

## 2017-12-20 DIAGNOSIS — N3592 Unspecified urethral stricture, female: Secondary | ICD-10-CM | POA: Diagnosis not present

## 2017-12-20 DIAGNOSIS — Z880 Allergy status to penicillin: Secondary | ICD-10-CM

## 2017-12-20 DIAGNOSIS — Z923 Personal history of irradiation: Secondary | ICD-10-CM

## 2017-12-20 DIAGNOSIS — Z79899 Other long term (current) drug therapy: Secondary | ICD-10-CM

## 2017-12-20 DIAGNOSIS — R0902 Hypoxemia: Secondary | ICD-10-CM

## 2017-12-20 DIAGNOSIS — N183 Chronic kidney disease, stage 3 (moderate): Secondary | ICD-10-CM | POA: Diagnosis not present

## 2017-12-20 DIAGNOSIS — Z79890 Hormone replacement therapy (postmenopausal): Secondary | ICD-10-CM

## 2017-12-20 DIAGNOSIS — I10 Essential (primary) hypertension: Secondary | ICD-10-CM | POA: Diagnosis present

## 2017-12-20 HISTORY — DX: Cyst of pancreas: K86.2

## 2017-12-20 HISTORY — DX: Other specified disorders of kidney and ureter: N28.89

## 2017-12-20 HISTORY — DX: Psoriasis, unspecified: L40.9

## 2017-12-20 LAB — CBC WITH DIFFERENTIAL/PLATELET
Basophils Absolute: 0 10*3/uL (ref 0.0–0.1)
Basophils Relative: 0 %
Eosinophils Absolute: 0 10*3/uL (ref 0.0–0.7)
Eosinophils Relative: 0 %
HCT: 34.9 % — ABNORMAL LOW (ref 36.0–46.0)
Hemoglobin: 11.7 g/dL — ABNORMAL LOW (ref 12.0–15.0)
Lymphocytes Relative: 2 %
Lymphs Abs: 0.4 10*3/uL — ABNORMAL LOW (ref 0.7–4.0)
MCH: 32.1 pg (ref 26.0–34.0)
MCHC: 33.5 g/dL (ref 30.0–36.0)
MCV: 95.6 fL (ref 78.0–100.0)
Monocytes Absolute: 1.5 10*3/uL — ABNORMAL HIGH (ref 0.1–1.0)
Monocytes Relative: 7 %
Neutro Abs: 20 10*3/uL — ABNORMAL HIGH (ref 1.7–7.7)
Neutrophils Relative %: 91 %
Platelets: 166 10*3/uL (ref 150–400)
RBC: 3.65 MIL/uL — ABNORMAL LOW (ref 3.87–5.11)
RDW: 13.5 % (ref 11.5–15.5)
WBC: 21.9 10*3/uL — ABNORMAL HIGH (ref 4.0–10.5)

## 2017-12-20 LAB — URINALYSIS, ROUTINE W REFLEX MICROSCOPIC
Bilirubin Urine: NEGATIVE
Glucose, UA: NEGATIVE mg/dL
Hgb urine dipstick: NEGATIVE
Ketones, ur: NEGATIVE mg/dL
Leukocytes, UA: NEGATIVE
Nitrite: POSITIVE — AB
Protein, ur: 100 mg/dL — AB
Specific Gravity, Urine: 1.01 (ref 1.005–1.030)
pH: 6 (ref 5.0–8.0)

## 2017-12-20 LAB — COMPREHENSIVE METABOLIC PANEL
ALT: 23 U/L (ref 0–44)
AST: 39 U/L (ref 15–41)
Albumin: 4.3 g/dL (ref 3.5–5.0)
Alkaline Phosphatase: 67 U/L (ref 38–126)
Anion gap: 11 (ref 5–15)
BUN: 30 mg/dL — ABNORMAL HIGH (ref 8–23)
CO2: 19 mmol/L — ABNORMAL LOW (ref 22–32)
Calcium: 9.3 mg/dL (ref 8.9–10.3)
Chloride: 107 mmol/L (ref 98–111)
Creatinine, Ser: 1.04 mg/dL — ABNORMAL HIGH (ref 0.44–1.00)
GFR calc Af Amer: 56 mL/min — ABNORMAL LOW (ref >60)
GFR calc non Af Amer: 48 mL/min — ABNORMAL LOW (ref >60)
Glucose, Bld: 164 mg/dL — ABNORMAL HIGH (ref 70–99)
Potassium: 4.3 mmol/L (ref 3.5–5.1)
Sodium: 137 mmol/L (ref 135–145)
Total Bilirubin: 0.7 mg/dL (ref 0.3–1.2)
Total Protein: 7.5 g/dL (ref 6.5–8.1)

## 2017-12-20 LAB — URINALYSIS, MICROSCOPIC (REFLEX): WBC, UA: >50 WBC/hpf (ref 0–5)

## 2017-12-20 LAB — T4, FREE: Free T4: 0.81 ng/dL — ABNORMAL LOW (ref 0.82–1.77)

## 2017-12-20 LAB — LIPASE, BLOOD: Lipase: 22 U/L (ref 11–51)

## 2017-12-20 LAB — TSH: TSH: 6.861 u[IU]/mL — ABNORMAL HIGH (ref 0.350–4.500)

## 2017-12-20 MED ORDER — SODIUM CHLORIDE 0.9 % IV SOLN
1.0000 g | INTRAVENOUS | Status: DC
  Administered 2017-12-20 – 2017-12-24 (×5): 1 g via INTRAVENOUS
  Filled 2017-12-20 (×5): qty 1
  Filled 2017-12-20: qty 10

## 2017-12-20 MED ORDER — CLOPIDOGREL BISULFATE 75 MG PO TABS
75.0000 mg | ORAL_TABLET | Freq: Every day | ORAL | Status: DC
  Administered 2017-12-21 – 2017-12-25 (×4): 75 mg via ORAL
  Filled 2017-12-20 (×6): qty 1

## 2017-12-20 MED ORDER — ROPINIROLE HCL 1 MG PO TABS: 0.5000 mg | ORAL_TABLET | Freq: Every day | ORAL | Status: DC

## 2017-12-20 MED ORDER — CIPROFLOXACIN IN D5W 400 MG/200ML IV SOLN
400.0000 mg | Freq: Once | INTRAVENOUS | Status: AC
  Administered 2017-12-20: 400 mg via INTRAVENOUS
  Filled 2017-12-20: qty 200

## 2017-12-20 MED ORDER — SODIUM CHLORIDE 0.9 % IV SOLN
INTRAVENOUS | Status: DC
  Administered 2017-12-20 – 2017-12-21 (×4): via INTRAVENOUS

## 2017-12-20 MED ORDER — ONDANSETRON HCL 4 MG/2ML IJ SOLN
4.0000 mg | Freq: Four times a day (QID) | INTRAMUSCULAR | Status: DC | PRN
  Administered 2017-12-20 – 2017-12-21 (×4): 4 mg via INTRAVENOUS
  Filled 2017-12-20 (×4): qty 2

## 2017-12-20 MED ORDER — ONDANSETRON HCL 4 MG/2ML IJ SOLN
4.0000 mg | Freq: Once | INTRAMUSCULAR | Status: AC
  Administered 2017-12-20: 4 mg via INTRAVENOUS

## 2017-12-20 MED ORDER — LEVOTHYROXINE SODIUM 112 MCG PO TABS
112.0000 ug | ORAL_TABLET | Freq: Every day | ORAL | Status: DC
Start: 2017-12-21 — End: 2017-12-25
  Administered 2017-12-21 – 2017-12-25 (×4): 112 ug via ORAL
  Filled 2017-12-20 (×5): qty 1

## 2017-12-20 MED ORDER — IOPAMIDOL (ISOVUE-300) INJECTION 61%
100.0000 mL | Freq: Once | INTRAVENOUS | Status: AC | PRN
  Administered 2017-12-20: 100 mL via INTRAVENOUS

## 2017-12-20 MED ORDER — ONDANSETRON HCL 4 MG PO TABS: 4.0000 mg | ORAL_TABLET | Freq: Four times a day (QID) | ORAL | Status: DC | PRN

## 2017-12-20 MED ORDER — PRAVASTATIN SODIUM 20 MG PO TABS
40.0000 mg | ORAL_TABLET | Freq: Every day | ORAL | Status: DC
  Administered 2017-12-20 – 2017-12-25 (×6): 40 mg via ORAL
  Filled 2017-12-20 (×3): qty 2
  Filled 2017-12-20: qty 1
  Filled 2017-12-20 (×2): qty 2

## 2017-12-20 MED ORDER — ACETAMINOPHEN 650 MG RE SUPP: 650.0000 mg | Freq: Four times a day (QID) | RECTAL | Status: DC | PRN

## 2017-12-20 MED ORDER — ONDANSETRON HCL 4 MG/2ML IJ SOLN
INTRAMUSCULAR | Status: AC
  Administered 2017-12-20: 4 mg via INTRAVENOUS
  Filled 2017-12-20: qty 2

## 2017-12-20 MED ORDER — MORPHINE SULFATE (PF) 4 MG/ML IV SOLN
4.0000 mg | Freq: Once | INTRAVENOUS | Status: AC | PRN
  Administered 2017-12-20: 4 mg via INTRAVENOUS
  Filled 2017-12-20 (×2): qty 1

## 2017-12-20 MED ORDER — DOCUSATE SODIUM 100 MG PO CAPS
100.0000 mg | ORAL_CAPSULE | Freq: Two times a day (BID) | ORAL | Status: DC
  Administered 2017-12-21 – 2017-12-25 (×7): 100 mg via ORAL
  Filled 2017-12-20 (×8): qty 1

## 2017-12-20 MED ORDER — HYDRALAZINE HCL 50 MG PO TABS
100.0000 mg | ORAL_TABLET | Freq: Three times a day (TID) | ORAL | Status: DC
  Administered 2017-12-20 – 2017-12-25 (×14): 100 mg via ORAL
  Filled 2017-12-20 (×14): qty 2

## 2017-12-20 MED ORDER — ONDANSETRON HCL 4 MG/2ML IJ SOLN
4.0000 mg | Freq: Once | INTRAMUSCULAR | Status: AC
Start: 2017-12-20 — End: 2017-12-20
  Administered 2017-12-20: 4 mg via INTRAVENOUS
  Filled 2017-12-20 (×2): qty 2

## 2017-12-20 MED ORDER — ACETAMINOPHEN 325 MG PO TABS
650.0000 mg | ORAL_TABLET | Freq: Four times a day (QID) | ORAL | Status: DC | PRN
  Administered 2017-12-22 – 2017-12-25 (×5): 650 mg via ORAL
  Filled 2017-12-20 (×5): qty 2

## 2017-12-20 MED ORDER — MORPHINE SULFATE (PF) 4 MG/ML IV SOLN
6.0000 mg | Freq: Once | INTRAVENOUS | Status: AC
  Administered 2017-12-20: 6 mg via INTRAVENOUS
  Filled 2017-12-20: qty 2

## 2017-12-20 MED ORDER — ONDANSETRON HCL 4 MG/2ML IJ SOLN
4.0000 mg | Freq: Four times a day (QID) | INTRAMUSCULAR | Status: DC | PRN
  Administered 2017-12-20: 4 mg via INTRAVENOUS
  Filled 2017-12-20: qty 2

## 2017-12-20 MED ORDER — ASPIRIN EC 81 MG PO TBEC
81.0000 mg | DELAYED_RELEASE_TABLET | Freq: Every day | ORAL | Status: DC
  Administered 2017-12-23 – 2017-12-25 (×3): 81 mg via ORAL
  Filled 2017-12-20 (×3): qty 1

## 2017-12-20 MED ORDER — ENOXAPARIN SODIUM 40 MG/0.4ML ~~LOC~~ SOLN
40.0000 mg | SUBCUTANEOUS | Status: DC
  Administered 2017-12-20: 40 mg via SUBCUTANEOUS
  Filled 2017-12-20: qty 0.4

## 2017-12-20 MED ORDER — LABETALOL HCL 100 MG PO TABS: 100.0000 mg | ORAL_TABLET | Freq: Two times a day (BID) | ORAL | Status: DC

## 2017-12-20 MED ORDER — MORPHINE SULFATE (PF) 2 MG/ML IV SOLN
2.0000 mg | INTRAVENOUS | Status: DC | PRN
  Administered 2017-12-20 – 2017-12-21 (×8): 2 mg via INTRAVENOUS
  Filled 2017-12-20 (×8): qty 1

## 2017-12-20 MED ORDER — HYDRALAZINE HCL 20 MG/ML IJ SOLN
5.0000 mg | INTRAMUSCULAR | Status: DC | PRN
  Administered 2017-12-20 – 2017-12-21 (×5): 5 mg via INTRAVENOUS
  Filled 2017-12-20 (×5): qty 1

## 2017-12-20 MED ORDER — AMLODIPINE BESYLATE 10 MG PO TABS
10.0000 mg | ORAL_TABLET | Freq: Every day | ORAL | Status: DC
  Administered 2017-12-21 – 2017-12-25 (×5): 10 mg via ORAL
  Filled 2017-12-20 (×5): qty 1

## 2017-12-20 NOTE — ED Notes (Signed)
Patient refuses pain and nausea medication at present.  Will alert staff if she feels she needs medication

## 2017-12-20 NOTE — Progress Notes (Signed)
Patient is an 84 yo with h/o TIA; mesenteric ischemia; HNT; HLD; CAD; and R adrenal cancer presenting to [facility] with abdominal pain.  Patient with R flank and lower abdominal pain, n/v.  UA is c/w UTI.  WBC 20k.  CT with severe R hydronephrosis.  Has chronic atrophy after remote treatment for adrenal CA.  However, she did not have prior hydro.  Dr. Alinda Money from urology suggests outpatient consult.  She is still having pain and unable to tolerate PO meds.  Renal function is at baseline.  Anticipate short hospital stay so Obs status is appropriate.  Does not need telemetry.  Carlyon Shadow, M.D.

## 2017-12-20 NOTE — ED Notes (Signed)
ED Provider at bedside. 

## 2017-12-20 NOTE — ED Provider Notes (Signed)
[facility] EMERGENCY DEPARTMENT Provider Note   CSN: [id number] Arrival date & time: 12/20/17  8309     History   Chief Complaint Chief Complaint  Patient presents with  . Abdominal Pain    HPI Brooke Walker is a 84 y.o. female.  HPI  Past Medical History:  Diagnosis Date  . Adrenal cancer, right ([place])   . AKI (acute kidney injury) ([place])   . Anemia   . CKD (chronic kidney disease)   . Coronary artery disease   . Hyperlipidemia   . Hypertension   . Mesenteric ischemia ([place])   . Pancreatic cyst   . Pericardial effusion   . Peripheral polyneuropathy   . Renal insufficiency   . Restless leg syndrome   . SVT (supraventricular tachycardia) ([place])   . Thyroid disease   . Transient ischemic attack (TIA)     There are no active problems to display for this patient.   History reviewed. No pertinent surgical history.   OB History   None      Home Medications    Prior to Admission medications   Medication Sig Start Date End Date Taking? Authorizing Provider  Apremilast (OTEZLA PO) Take by mouth.   Yes [provider]  amLODipine (NORVASC) 10 MG tablet Take by mouth. 08/01/12 10/02/18  [provider]  aspirin EC 81 MG tablet Take by mouth.    [provider]  clopidogrel (PLAVIX) 75 MG tablet TAKE 1 TABLET BY MOUTH EVERY DAY 08/04/17   [provider]  diphenhydrAMINE (BENADRYL) 25 mg capsule Take by mouth.    [provider]  estradiol (ESTRACE) 0.1 MG/GM vaginal cream Apply a pea-sized amount within vagina 2-3 times per week 08/04/16   [provider]  hydrALAZINE (APRESOLINE) 100 MG tablet Take 100 mg by mouth 3 (three) times daily. 10/14/17   [provider]  hydrochlorothiazide (HYDRODIURIL) 25 MG tablet Take by mouth. 03/24/17   [provider]  ketoconazole (NIZORAL) 2 % shampoo Apply 1 application topically 3 (three) times a week. Use three times weekly for two weeks. 11/15/17   Quintella Reichert, MD  labetalol (NORMODYNE) 200 MG tablet TAKE 1/2 TABLET BY MOUTH 2 TIMES DAILY 10/02/17   [provider]  levothyroxine (SYNTHROID, LEVOTHROID) 112 MCG tablet TAKE 1 TABLET (112 MCG TOTAL) BY MOUTH DAILY AT 6AM 09/18/17   [provider]  pravastatin (PRAVACHOL) 40 MG tablet TAKE 1 TABLET BY MOUTH EVERY DAY AT NIGHT 08/15/12   [provider]  rOPINIRole (REQUIP) 0.25 MG tablet TAKE 2 TO 3 TABLETS BY MOUTH AT BEDTIME 08/15/17   [provider]  telmisartan (MICARDIS) 40 MG tablet Take 40 mg by mouth daily. 10/20/17   [provider]    Family History History reviewed. No pertinent family history.  Social History Social History   Tobacco Use  . Smoking status: Never Smoker  . Smokeless tobacco: Never Used  Substance Use Topics  . Alcohol use: Never    Frequency: Never  . Drug use: Never     Allergies   Amoxicillin; Clonidine derivatives; Metoprolol; and Sulfa antibiotics   Review of Systems Review of Systems  All systems reviewed and negative, other than as noted in HPI.  Physical Exam Updated Vital Signs BP (!) 173/45   Pulse 82   Temp 98.2 F (36.8 C) (Oral)   Resp 16   Ht 5\' 4"  (1.626 m)   Wt 54.4 kg (120 lb)   SpO2 95%  BMI 20.60 kg/m   Physical Exam  Constitutional: She appears well-developed and well-nourished.  Laying in bed with eyes closed. Looks really uncomfortable.   HENT:  Head: Normocephalic and atraumatic.  Eyes: Conjunctivae are normal. Right eye exhibits no discharge. Left eye exhibits no discharge.  Neck: Neck supple.  Cardiovascular: Normal rate, regular rhythm and normal heart sounds. Exam reveals no gallop and no friction rub.  No murmur heard. Pulmonary/Chest: Effort normal and breath sounds normal. No respiratory distress.  Abdominal: Soft. She exhibits no distension. There is no tenderness.  CVA tenderness.  Tenderness to the right flank, right lower quadrant and suprapubically.  No  rebound or guarding.  Musculoskeletal: She exhibits no edema or tenderness.  Neurological: She is alert.  Skin: Skin is warm and dry.  Psychiatric: She has a normal mood and affect. Her behavior is normal. Thought content normal.  Nursing note and vitals reviewed.    ED Treatments / Results  Labs (all labs ordered are listed, but only abnormal results are displayed) Labs Reviewed  URINALYSIS, ROUTINE W REFLEX MICROSCOPIC - Abnormal; Notable for the following components:      Result Value   APPearance HAZY (*)    Protein, ur 100 (*)    Nitrite POSITIVE (*)    All other components within normal limits  URINALYSIS, MICROSCOPIC (REFLEX) - Abnormal; Notable for the following components:   Bacteria, UA MANY (*)    All other components within normal limits  CBC WITH DIFFERENTIAL/PLATELET - Abnormal; Notable for the following components:   WBC 21.9 (*)    RBC 3.65 (*)    Hemoglobin 11.7 (*)    HCT 34.9 (*)    Neutro Abs 20.0 (*)    Lymphs Abs 0.4 (*)    Monocytes Absolute 1.5 (*)    All other components within normal limits  COMPREHENSIVE METABOLIC PANEL - Abnormal; Notable for the following components:   CO2 19 (*)    Glucose, Bld 164 (*)    BUN 30 (*)    Creatinine, Ser 1.04 (*)    GFR calc non Af Amer 48 (*)    GFR calc Af Amer 56 (*)    All other components within normal limits  URINE CULTURE  LIPASE, BLOOD    EKG None  Radiology Ct Abdomen Pelvis W Contrast  Result Date: 12/20/2017 CLINICAL DATA:  Abdominal pain, nausea vomiting since yesterday EXAM: CT ABDOMEN AND PELVIS WITH CONTRAST TECHNIQUE: Multidetector CT imaging of the abdomen and pelvis was performed using the standard protocol following bolus administration of intravenous contrast. CONTRAST:  137mL ISOVUE-300 IOPAMIDOL (ISOVUE-300) INJECTION 61% COMPARISON:  November 03 2017 FINDINGS: Lower chest: Mild atelectasis of bilateral lung bases are noted. The heart size is mildly enlarged. There is a small pericardial  effusion. Hepatobiliary: No focal liver lesion is identified. Patient status post prior cholecystectomy. Intra and extrahepatic biliary ductal dilatation are identified unchanged. Pancreas: There is a 2.2 x 2.6 cm cyst in the tail of the pancreas. A second 0.8 cm cyst is identified in the tail of the pancreas likely present on the prior CT. The pancreas is otherwise unremarkable. Spleen: Normal in size without focal abnormality. Adrenals/Urinary Tract: The left adrenal gland is normal. There is a 1.5 x 1.8 cm mixed cystic and solid enhancing mass in the anterior midpole left kidney. There is no left hydronephrosis. The patient status post prior right adrenal resection. There is atrophic right kidney with significant hydronephrosis and minimal proximal hydroureter though the majority of the right  ureter is normal caliber. This is unchanged compared prior CT. There is right perinephric edema. There are 2 focus of air within the bladder lumen question recent instrumentation. Stomach/Bowel: The stomach is normal. There is no small bowel obstruction. There is mild diffuse bowel wall thickening of the left-sided transverse colon which is under distended. There is diverticulosis of colon. The appendix is not seen but no inflammation is noted surrounding the cecum. Vascular/Lymphatic: Marked aortic atherosclerosis. No enlarged abdominal or pelvic lymph nodes. Reproductive: Status post hysterectomy. No adnexal masses. Other: None. Musculoskeletal: Degenerative joint changes of the spine are noted. IMPRESSION: Diffuse bowel wall thickening of the left side transverse colon. This can be seen in colitis. No small bowel obstruction. Mixed cystic and solid enhancing mass in the midpole left kidney. Renal cell carcinoma not excluded. Further evaluation MR of kidneys is recommended. Cysts in the tail of pancreas not significantly changed compared to prior CT. Atrophic right kidney with significant right hydronephrosis and minimal  proximal hydroureter not significantly changed compared prior CT. Electronically Signed   By: Abelardo Diesel M.D.   On: 12/20/2017 11:29    Procedures Procedures (including critical care time)  Medications Ordered in ED Medications  0.9 %  sodium chloride infusion ( Intravenous New Bag/Given 12/20/17 1006)  morphine 4 MG/ML injection 6 mg (6 mg Intravenous Given 12/20/17 1002)  ciprofloxacin (CIPRO) IVPB 400 mg (400 mg Intravenous New Bag/Given 12/20/17 1008)  ondansetron (ZOFRAN) injection 4 mg (4 mg Intravenous Given 12/20/17 1202)  morphine 4 MG/ML injection 4 mg (4 mg Intravenous Given 12/20/17 1206)  iopamidol (ISOVUE-300) 61 % injection 100 mL (100 mLs Intravenous Contrast Given 12/20/17 1058)     Initial Impression / Assessment and Plan / ED Course  I have reviewed the triage vital signs and the nursing notes.  Pertinent labs & imaging results that were available during my care of the patient were reviewed by me and considered in my medical decision making (see chart for details).  Clinical Course as of Dec 20 1244  Wed Dec 20, 2017  1043 Pain better. Still nauseated. Zofran ordered and also another dose of morphine PRN and she was encouraged to let us know if she feels like she needs it. UA/labs reviewed with pt and daughter. CT pending.    [SK]    Clinical Course User Index [SK] Virgel Manifold, MD    84 year old female with right flank pain.  UA is consistent with UTI.  She was seen approximately 2 months ago with similar symptoms.  CT at that time showed right hydronephrosis without obvious source of obstruction.  UA was consistent with UTI then as well.    She has a distant history of adrenal cancer and she was treated with resection and radiation.  She may potentially have some degree of scarring/stricture from this or from recurrent infections.  We will treat her symptoms.  Ciprofloxacin based on her allergies and susceptibility of last urine culture.  Given her degree of pain will  image again to further evaluate for other potential etiologies.  Imaging as above. Again with significant hydronephrosis. Additional abnormalities noted, but not that I suspect are contributory to current presentation. No mention of R hydronephrosis on CT a/p in June/July 2016 or 12/2016 on imaging done through [facility] has had prior nephrology evaluations but not urology. Discussed briefly with Dr Alinda Money, urology. Would treat as pyelonephritis at this point. With new recurrent or persistent hydronephrosis, would have this further evaluated. Without fever though and  baseline renal function this can be done on outpatient basis. Per review of prior labs through [facility],  baseline Cr seems to be ~0.90-1 with GFR ~50-60.  Pt/daughter updated. Pt feeling increasingly nauseated again and more pain. Requiring more meds. With her age, may be more prudent to admit for ongoing treatment until she can reliably tolerate PO meds.     Final Clinical Impressions(s) / ED Diagnoses   Final diagnoses:  Pyelonephritis  Hydronephrosis of right kidney    ED Discharge Orders    None       Virgel Manifold, MD 12/20/17 1253

## 2017-12-20 NOTE — ED Notes (Signed)
Oxygen saturation dropped to 85% on RA.  Placed on 2L oxygen via [place]

## 2017-12-20 NOTE — ED Triage Notes (Signed)
Reports right lower quadrant pain since last night.  Seen previously for same approximately 6 weeks ago and diagnosed with a "kidney infection".  C/o nausea and vomiting without diarrhea.  Denies dysuria, hematuria.

## 2017-12-20 NOTE — H&P (Signed)
History and Physical    Brooke Walker HCW:[id number] DOB: May 12, 1931 DOA: 12/20/2017  PCP: [name] I, MD Consultants:  [facility] - nephrology [facility]); [facility] - referred, has appt 8/27, also nephrology; [facility] - vascular; [facility] - cardiology; Jerene Pitch - GI Patient coming from:  [facility]; NOK: Daughter-in-law - [phone]  Chief Complaint: R flank pain  HPI: Brooke Walker is a 84 y.o. female with medical history significant of TIA; mesenteric ischemia; HTN; HLD; CAD; and R adrenal cancer presenting with right flank pain.  She has been having extreme pain along the right flank.  The pain started about 10pm while she was getting ready for bed.  The pain kept her up all night and she vomited 3 times.  This [place]  She went to the doctor after she got home and they thought they were some inflammation and the ureter was blocked.  It settled down some but seemed to flare again.  They did not see nephrology after the June hospitalization but she does have an appointment later this month.  She did see her vascular surgeon, who said that the stents appeared to be open.  She is having significant pain.   ED Course: Patient with R flank and lower abdominal pain, n/v.  UA is c/w UTI.  WBC 20k.  CT with severe R hydronephrosis.  Has chronic atrophy after remote treatment for adrenal CA.  However, she did not have prior hydro.  Dr. Alinda Money from urology suggests outpatient consult.  She is still having pain and unable to tolerate PO meds.  Renal function is at baseline.  Anticipate short hospital stay so Obs status is appropriate.  Does not need telemetry.    Review of Systems: As per HPI; otherwise review of systems reviewed and negative.   Ambulatory Status:  Ambulates without assistance  Past Medical History:  Diagnosis Date  . Adrenal cancer, right ([place])   . AKI (acute kidney injury) ([place])   . Anemia   . CKD (chronic kidney  disease)   . Coronary artery disease   . Hyperlipidemia   . Hypertension   . Mesenteric ischemia ([place])   . Pancreatic cyst   . Pericardial effusion   . Peripheral polyneuropathy   . Psoriasis   . Renal insufficiency   . Restless leg syndrome   . SVT (supraventricular tachycardia) ([place])   . Thyroid disease   . Transient ischemic attack (TIA)     Past Surgical History:  Procedure Laterality Date  . ABDOMINAL HYSTERECTOMY  1975  . ADRENALECTOMY Right    with radiation  . mesenteric ischemia stents    . URETERAL STENT PLACEMENT      Social History   Socioeconomic History  . Marital status: Widowed    Spouse name: Not on file  . Number of children: Not on file  . Years of education: Not on file  . Highest education level: Not on file  Occupational History  . Occupation: retired  Scientific laboratory technician  . Financial resource strain: Not on file  . Food insecurity:    Worry: Not on file    Inability: Not on file  . Transportation needs:    Medical: Not on file    Non-medical: Not on file  Tobacco Use  . Smoking status: Never Smoker  . Smokeless tobacco: Never Used  Substance and Sexual Activity  . Alcohol use: Never    Frequency: Never  . Drug use:  Never  . Sexual activity: Not on file  Lifestyle  . Physical activity:    Days per week: Not on file    Minutes per session: Not on file  . Stress: Not on file  Relationships  . Social connections:    Talks on phone: Not on file    Gets together: Not on file    Attends religious service: Not on file    Active member of club or organization: Not on file    Attends meetings of clubs or organizations: Not on file    Relationship status: Not on file  . Intimate partner violence:    Fear of current or ex partner: Not on file    Emotionally abused: Not on file    Physically abused: Not on file    Forced sexual activity: Not on file  Other Topics Concern  . Not on file  Social History Narrative  . Not on file    Allergies    Allergen Reactions  . Amoxicillin   . Clonidine Derivatives   . Metoprolol   . Sulfa Antibiotics     History reviewed. No pertinent family history.  Prior to Admission medications   Medication Sig Start Date End Date Taking? Authorizing Provider  Apremilast (OTEZLA PO) Take by mouth.   Yes [provider]  amLODipine (NORVASC) 10 MG tablet Take by mouth. 08/01/12 10/02/18  [provider]  aspirin EC 81 MG tablet Take by mouth.    [provider]  clopidogrel (PLAVIX) 75 MG tablet TAKE 1 TABLET BY MOUTH EVERY DAY 08/04/17   [provider]  diphenhydrAMINE (BENADRYL) 25 mg capsule Take by mouth.    [provider]  estradiol (ESTRACE) 0.1 MG/GM vaginal cream Apply a pea-sized amount within vagina 2-3 times per week 08/04/16   [provider]  hydrALAZINE (APRESOLINE) 100 MG tablet Take 100 mg by mouth 3 (three) times daily. 10/14/17   [provider]  hydrochlorothiazide (HYDRODIURIL) 25 MG tablet Take by mouth. 03/24/17   [provider]  ketoconazole (NIZORAL) 2 % shampoo Apply 1 application topically 3 (three) times a week. Use three times weekly for two weeks. 11/15/17   Quintella Reichert, MD  labetalol (NORMODYNE) 200 MG tablet TAKE 1/2 TABLET BY MOUTH 2 TIMES DAILY 10/02/17   [provider]  levothyroxine (SYNTHROID, LEVOTHROID) 112 MCG tablet TAKE 1 TABLET (112 MCG TOTAL) BY MOUTH DAILY AT 6AM 09/18/17   [provider]  pravastatin (PRAVACHOL) 40 MG tablet TAKE 1 TABLET BY MOUTH EVERY DAY AT NIGHT 08/15/12   [provider]  rOPINIRole (REQUIP) 0.25 MG tablet TAKE 2 TO 3 TABLETS BY MOUTH AT BEDTIME 08/15/17   [provider]  telmisartan (MICARDIS) 40 MG tablet Take 40 mg by mouth daily. 10/20/17   [provider]    Physical Exam: Vitals:   12/20/17 1300 12/20/17 1330 12/20/17 1449 12/20/17 1454  BP: (!) 175/50 (!) 179/55 (!) 208/62   Pulse: 73 74 78 82  Resp: 16  13 18   Temp:    97.9 F (36.6 C)   TempSrc:   Oral   SpO2: 94% 91% (!) 84% 92%  Weight:      Height:         General:  Appears calm but somewhat uncomfortable with right renal colic pain Eyes:   EOMI, normal lids, iris ENT:  Hard of hearing, normal lips & tongue, mmm Neck:  no LAD, masses or thyromegaly Cardiovascular:  Irregularly irregular rate/rhythm -  possible sinus arrhythmia, no m/r/g. No LE edema.  Respiratory:   CTA bilaterally with no wheezes/rales/rhonchi.  Normal respiratory effort. Abdomen:  soft, TTP along right flank/RLQ, ND, NABS Skin:  no rash or induration seen on limited exam Musculoskeletal:  grossly normal tone BUE/BLE, good ROM, no bony abnormality Lower extremity:  No LE edema.  Limited foot exam with no ulcerations.  2+ distal pulses. Psychiatric:  grossly normal mood and affect, speech fluent and appropriate, AOx3 Neurologic:  CN 2-12 grossly intact, moves all extremities in coordinated fashion, sensation intact    Radiological Exams on Admission: Ct Abdomen Pelvis W Contrast  Result Date: 12/20/2017 CLINICAL DATA:  Abdominal pain, nausea vomiting since yesterday EXAM: CT ABDOMEN AND PELVIS WITH CONTRAST TECHNIQUE: Multidetector CT imaging of the abdomen and pelvis was performed using the standard protocol following bolus administration of intravenous contrast. CONTRAST:  159mL ISOVUE-300 IOPAMIDOL (ISOVUE-300) INJECTION 61% COMPARISON:  November 03 2017 FINDINGS: Lower chest: Mild atelectasis of bilateral lung bases are noted. The heart size is mildly enlarged. There is a small pericardial effusion. Hepatobiliary: No focal liver lesion is identified. Patient status post prior cholecystectomy. Intra and extrahepatic biliary ductal dilatation are identified unchanged. Pancreas: There is a 2.2 x 2.6 cm cyst in the tail of the pancreas. A second 0.8 cm cyst is identified in the tail of the pancreas likely present on the prior CT. The pancreas is otherwise unremarkable. Spleen: Normal in  size without focal abnormality. Adrenals/Urinary Tract: The left adrenal gland is normal. There is a 1.5 x 1.8 cm mixed cystic and solid enhancing mass in the anterior midpole left kidney. There is no left hydronephrosis. The patient status post prior right adrenal resection. There is atrophic right kidney with significant hydronephrosis and minimal proximal hydroureter though the majority of the right ureter is normal caliber. This is unchanged compared prior CT. There is right perinephric edema. There are 2 focus of air within the bladder lumen question recent instrumentation. Stomach/Bowel: The stomach is normal. There is no small bowel obstruction. There is mild diffuse bowel wall thickening of the left-sided transverse colon which is under distended. There is diverticulosis of colon. The appendix is not seen but no inflammation is noted surrounding the cecum. Vascular/Lymphatic: Marked aortic atherosclerosis. No enlarged abdominal or pelvic lymph nodes. Reproductive: Status post hysterectomy. No adnexal masses. Other: None. Musculoskeletal: Degenerative joint changes of the spine are noted. IMPRESSION: Diffuse bowel wall thickening of the left side transverse colon. This can be seen in colitis. No small bowel obstruction. Mixed cystic and solid enhancing mass in the midpole left kidney. Renal cell carcinoma not excluded. Further evaluation MR of kidneys is recommended. Cysts in the tail of pancreas not significantly changed compared to prior CT. Atrophic right kidney with significant right hydronephrosis and minimal proximal hydroureter not significantly changed compared prior CT. Electronically Signed   By: Abelardo Diesel M.D.   On: 12/20/2017 11:29    EKG:  pending  Labs on Admission: I have personally reviewed the available labs and imaging studies at the time of the admission.  Pertinent labs:   CO2 19; 21 in 6/21 Glucose 164 BUN 30/Creatinine 1.04/GFR 48 - improved from 6/21 CMP otherwise  WNL WBC 21.9 Hgb 11.7; improved from 6/21 UA: negative LE, positive nitrite, 100 protein, many bacteria Urine culture is pending Prior urine culture from 6/21 with Klebsiella, resistant to ampicillin and nitrofurantoin  Assessment/Plan Principal Problem:   Pyelonephritis Active Problems:   Hyperglycemia   Essential hypertension   Left  renal mass   Hydronephrosis of right kidney   Arrhythmia   Pancreatic cyst   Pyelonephritis with hydronephrosis, renal atrophy -Patient with h/o similar presentation in June -Marked right flank pain -Denies urinary symptoms otherwise -Imaging shows right renal atrophy with significant hydronephrosis, generally unchanged from June -ER PA called her nephrologist from [facility] in June and he recommended serial exams, bowel cleanout, and reassessment of symptoms; the patient chose outpatient care and was given Cipro and Miralax -Dr. Alinda Money from urology consulted this admission and recommends observation with treatment for pyelo and plan for outpatient evaluation of recurrent/persistent hydronephrosis -Will observe for now -IVF -Rocephin for empiric treatment of UTI while awaiting cultures -Pain control with morphine/nausea control with Zofran -If not improving, may need inpatient urology consultation  Left renal mass -There appears to be a new left renal mixed cystic and solid mass, possibly renal cell CA -Consider inpatient vs. Outpatient MRI  Pancreatic cyst -Appears stable on CT  HTN -Continue Norvasc, Lopressor, and PO hydralazine -Provide IV prn hydralazine -Hold HCTZ and ACE at this time -Continue ASA and Plavix for ASCVD  Hyperglycemia -May be stress response -Will follow with fasting AM labs -It is unlikely that she will need acute or chronic treatment for this issue   Hypothyroidism -Check TSH and free T4 -Continue Synthroid at current dose for now   DVT prophylaxis: Lovenox  Code Status: DNR - confirmed with  patient/family Family Communication: Daughter and daughter-in-law present throughout evaluation Disposition Plan:  Home once clinically improved Consults called: Urology by telephone  Admission status: It is my clinical opinion that referral for OBSERVATION is reasonable and necessary in this patient based on the above information provided. The aforementioned taken together are felt to place the patient at high risk for further clinical deterioration. However it is anticipated that the patient may be medically stable for discharge from the hospital within 24 to 48 hours.    Karmen Bongo MD [facility]  If note is complete, please contact covering daytime or nighttime physician. [email or web address] Password [email or web address]  12/20/2017, 6:01 PM

## 2017-12-21 ENCOUNTER — Observation Stay (HOSPITAL_COMMUNITY): Payer: Medicare Other

## 2017-12-21 DIAGNOSIS — D509 Iron deficiency anemia, unspecified: Secondary | ICD-10-CM | POA: Diagnosis present

## 2017-12-21 DIAGNOSIS — N2889 Other specified disorders of kidney and ureter: Secondary | ICD-10-CM

## 2017-12-21 DIAGNOSIS — K862 Cyst of pancreas: Secondary | ICD-10-CM

## 2017-12-21 DIAGNOSIS — N1330 Unspecified hydronephrosis: Secondary | ICD-10-CM

## 2017-12-21 DIAGNOSIS — I1310 Hypertensive heart and chronic kidney disease without heart failure, with stage 1 through stage 4 chronic kidney disease, or unspecified chronic kidney disease: Secondary | ICD-10-CM | POA: Diagnosis present

## 2017-12-21 DIAGNOSIS — I959 Hypotension, unspecified: Secondary | ICD-10-CM | POA: Diagnosis not present

## 2017-12-21 DIAGNOSIS — N12 Tubulo-interstitial nephritis, not specified as acute or chronic: Secondary | ICD-10-CM | POA: Diagnosis not present

## 2017-12-21 DIAGNOSIS — G629 Polyneuropathy, unspecified: Secondary | ICD-10-CM | POA: Diagnosis present

## 2017-12-21 DIAGNOSIS — E039 Hypothyroidism, unspecified: Secondary | ICD-10-CM | POA: Diagnosis present

## 2017-12-21 DIAGNOSIS — I10 Essential (primary) hypertension: Secondary | ICD-10-CM

## 2017-12-21 DIAGNOSIS — R739 Hyperglycemia, unspecified: Secondary | ICD-10-CM

## 2017-12-21 DIAGNOSIS — E785 Hyperlipidemia, unspecified: Secondary | ICD-10-CM | POA: Diagnosis present

## 2017-12-21 DIAGNOSIS — R109 Unspecified abdominal pain: Secondary | ICD-10-CM | POA: Diagnosis present

## 2017-12-21 DIAGNOSIS — I340 Nonrheumatic mitral (valve) insufficiency: Secondary | ICD-10-CM | POA: Diagnosis not present

## 2017-12-21 DIAGNOSIS — Z66 Do not resuscitate: Secondary | ICD-10-CM | POA: Diagnosis present

## 2017-12-21 DIAGNOSIS — G2581 Restless legs syndrome: Secondary | ICD-10-CM | POA: Diagnosis present

## 2017-12-21 DIAGNOSIS — N3592 Unspecified urethral stricture, female: Secondary | ICD-10-CM | POA: Diagnosis present

## 2017-12-21 DIAGNOSIS — J9601 Acute respiratory failure with hypoxia: Secondary | ICD-10-CM | POA: Diagnosis present

## 2017-12-21 DIAGNOSIS — I161 Hypertensive emergency: Secondary | ICD-10-CM | POA: Diagnosis present

## 2017-12-21 DIAGNOSIS — I2510 Atherosclerotic heart disease of native coronary artery without angina pectoris: Secondary | ICD-10-CM | POA: Diagnosis present

## 2017-12-21 DIAGNOSIS — N183 Chronic kidney disease, stage 3 (moderate): Secondary | ICD-10-CM | POA: Diagnosis present

## 2017-12-21 DIAGNOSIS — E875 Hyperkalemia: Secondary | ICD-10-CM | POA: Diagnosis present

## 2017-12-21 DIAGNOSIS — N136 Pyonephrosis: Secondary | ICD-10-CM | POA: Diagnosis present

## 2017-12-21 DIAGNOSIS — J811 Chronic pulmonary edema: Secondary | ICD-10-CM | POA: Diagnosis present

## 2017-12-21 DIAGNOSIS — D696 Thrombocytopenia, unspecified: Secondary | ICD-10-CM | POA: Diagnosis present

## 2017-12-21 DIAGNOSIS — I313 Pericardial effusion (noninflammatory): Secondary | ICD-10-CM | POA: Diagnosis present

## 2017-12-21 DIAGNOSIS — Z85858 Personal history of malignant neoplasm of other endocrine glands: Secondary | ICD-10-CM | POA: Diagnosis not present

## 2017-12-21 DIAGNOSIS — L409 Psoriasis, unspecified: Secondary | ICD-10-CM | POA: Diagnosis present

## 2017-12-21 LAB — BASIC METABOLIC PANEL
ANION GAP: 7 (ref 5–15)
Anion gap: 7 (ref 5–15)
BUN: 23 mg/dL (ref 8–23)
BUN: 23 mg/dL (ref 8–23)
CALCIUM: 8.8 mg/dL — AB (ref 8.9–10.3)
CO2: 25 mmol/L (ref 22–32)
CO2: 24 mmol/L (ref 22–32)
CREATININE: 1.26 mg/dL — AB (ref 0.44–1.00)
Calcium: 8.9 mg/dL (ref 8.9–10.3)
Chloride: 112 mmol/L — ABNORMAL HIGH (ref 98–111)
Chloride: 108 mmol/L (ref 98–111)
Creatinine, Ser: 1.2 mg/dL — ABNORMAL HIGH (ref 0.44–1.00)
GFR, EST AFRICAN AMERICAN: 44 mL/min — AB (ref >60)
GFR, EST AFRICAN AMERICAN: 47 mL/min — AB (ref >60)
GFR, EST NON AFRICAN AMERICAN: 38 mL/min — AB (ref >60)
GFR, EST NON AFRICAN AMERICAN: 40 mL/min — AB (ref >60)
Glucose, Bld: 118 mg/dL — ABNORMAL HIGH (ref 70–99)
Glucose, Bld: 131 mg/dL — ABNORMAL HIGH (ref 70–99)
POTASSIUM: 6.1 mmol/L — AB (ref 3.5–5.1)
POTASSIUM: 4.9 mmol/L (ref 3.5–5.1)
SODIUM: 144 mmol/L (ref 135–145)
Sodium: 139 mmol/L (ref 135–145)

## 2017-12-21 LAB — CBC
HCT: 33.7 % — ABNORMAL LOW (ref 36.0–46.0)
Hemoglobin: 10.6 g/dL — ABNORMAL LOW (ref 12.0–15.0)
MCH: 31.1 pg (ref 26.0–34.0)
MCHC: 31.5 g/dL (ref 30.0–36.0)
MCV: 98.8 fL (ref 78.0–100.0)
PLATELETS: 145 10*3/uL — AB (ref 150–400)
RBC: 3.41 MIL/uL — AB (ref 3.87–5.11)
RDW: 14.3 % (ref 11.5–15.5)
WBC: 22 10*3/uL — AB (ref 4.0–10.5)

## 2017-12-21 LAB — BLOOD GAS, ARTERIAL
ACID-BASE DEFICIT: 4.1 mmol/L — AB (ref 0.0–2.0)
BICARBONATE: 20.6 mmol/L (ref 20.0–28.0)
Drawn by: 358491
FIO2: 60
O2 SAT: 93.9 %
PATIENT TEMPERATURE: 98.6
pCO2 arterial: 38.5 mmHg (ref 32.0–48.0)
pH, Arterial: 7.348 — ABNORMAL LOW (ref 7.350–7.450)
pO2, Arterial: 70.5 mmHg — ABNORMAL LOW (ref 83.0–108.0)

## 2017-12-21 LAB — MRSA PCR SCREENING: MRSA by PCR: NEGATIVE

## 2017-12-21 MED ORDER — HYDRALAZINE HCL 20 MG/ML IJ SOLN
10.0000 mg | Freq: Four times a day (QID) | INTRAMUSCULAR | Status: DC | PRN
  Administered 2017-12-21 – 2017-12-23 (×5): 10 mg via INTRAVENOUS
  Filled 2017-12-21 (×6): qty 1

## 2017-12-21 MED ORDER — FUROSEMIDE 10 MG/ML IJ SOLN
40.0000 mg | Freq: Once | INTRAMUSCULAR | Status: AC
  Administered 2017-12-21: 40 mg via INTRAVENOUS
  Filled 2017-12-21: qty 4

## 2017-12-21 MED ORDER — SODIUM POLYSTYRENE SULFONATE 15 GM/60ML PO SUSP
30.0000 g | Freq: Once | ORAL | Status: AC
  Administered 2017-12-21: 30 g via ORAL
  Filled 2017-12-21: qty 120

## 2017-12-21 MED ORDER — ENOXAPARIN SODIUM 30 MG/0.3ML ~~LOC~~ SOLN
30.0000 mg | SUBCUTANEOUS | Status: DC
  Administered 2017-12-21 – 2017-12-24 (×4): 30 mg via SUBCUTANEOUS
  Filled 2017-12-21 (×4): qty 0.3

## 2017-12-21 MED ORDER — SODIUM CHLORIDE 0.9 % IV SOLN
1.0000 g | Freq: Once | INTRAVENOUS | Status: AC
  Administered 2017-12-21: 1 g via INTRAVENOUS
  Filled 2017-12-21: qty 10

## 2017-12-21 NOTE — Consult Note (Signed)
Urology Consult  CC: Referring physician: Irene Pap, DO Reason for referral: Right flank pain  Impression/Assessment:  1.  Acute right flank pain: The exact etiology is unclear at this point but it is obvious that she has minimal renal function on that side and what appears to be long-standing hydronephrosis due to some form of obstruction that has yet to be determined.  Her right flank pain is improving.  At this point because it is improving I do not feel any form of urgent intervention is necessary.  2.  Right hydronephrosis: She has severe right hydronephrosis with loss of renal parenchyma indicating long-standing obstruction.  This should be evaluated for the source at some point however my suspicion is with previous radiation to her kidney retrograde access may not be attainable.  We did discuss both retrograde stenting and percutaneous nephrostomy tube placement should she demonstrate clinical signs of sepsis or clinical deterioration but at this time she is afebrile, has no tachycardia and no hypotension.  Her pain is improving.  She does however continue to maintain an elevated white blood cell count.  My thought is that if she did experience clinical deterioration of some form a nephrostomy tube would be the most likely means of decompressing the system as the cause of her obstruction is unknown and retrograde access may not be attainable.  If her system is drained there is a risk associated with eventual removal of the drain as this is a closed system and if bacteria are introduced she would almost certainly develop a significant infection and the alternative of maintaining drainage of her system with a stent in a system that is nonfunctioning would not be in her best interest either.  I have discussed this with the patient and her two daughters today at length.  3.  Left renal lesion: She has a small left renal lesion that is currently being followed by her oncologist at [facility].   The current plan is surveillance according to the patient.  4.  Possible UTI: While the patient has had multiple UTIs she is familiar with the symptoms and was not experiencing any of these.  She did however have many bacteria in her urine specimen with >50 white blood cells seen.  A urine culture is pending.  She is currently on Rocephin.   Plan:  1.  At this time I did not recommend any form of drainage of her right kidney. 2.  Continue IV antibiotics. 3.  Await urine culture results. 4.  She will eventually need to undergo some form of procedure to evaluate the cause of her obstruction.    History of Present Illness: Brooke Walker is a very pleasant 84 year old female seen in hospital consultation today for further evaluation of right flank pain and hydronephrosis.  She has a rather complex past urologic history that began approximately 33 years ago when she was diagnosed with some form of adrenal cancer that she was treated for at [facility] where they gave her radiation to her adrenal gland and kidney and did an adrenalectomy.  She was told at the time that radiation to the kidney would likely cause damage to the kidney.   Over the years she said she has had difficulty with recurrent UTIs.  The symptoms she experiences with her UTIs are those of dysuria, frequency and urgency and by her account she says she has had over 55 UTIs. 6-7 weeks ago she experienced right flank pain and pain in the right upper quadrant.  She was seen in the emergency room where she was diagnosed with a UTI and placed on Keflex.  It took some time for the Keflex to result in resolution of her flank pain but eventually it seemed to do so although she feels that her flank pain may have persisted to a very slight degree since that time. 48 hours ago she developed what she describes as extremely severe right upper quadrant pain with eventual radiation into the flank.  It was constant and not relieved by positional change.  It was not  associated with any hematuria.  She also reports that she had no symptoms of UTI as she is quite familiar with those symptoms.  She underwent a CT scan which revealed marked right hydronephrosis with no ureteral stone identified and significant loss of renal parenchyma indicating long-standing obstruction.  A CT scan in 6/19 revealed a similar degree of hydronephrosis and according to her daughters she apparently had a CT scan elsewhere in 12/18 that they say did not indicate the presence of significant hydronephrosis although those images are unavailable.  Since being admitted to the hospital her pain has diminished markedly although she still has some slight dull aching in the right flank.  She has not experienced any hematuria. In addition she has a lesion that has been identified in the left kidney about 1.5 years ago and is being followed by Dr. Gwynneth Macleod, her oncologist at [facility], who has recommended observation only due to its small size and the fact that she has essentially only a single renal unit.  The fact that she has been known to have no significant function on the right-hand side at least a year and a half ago indicates that she probably has not had any significant renal function on that side for many years.  Past Medical History:  Diagnosis Date  . Adrenal cancer, right ([place])   . AKI (acute kidney injury) ([place])   . Anemia   . CKD (chronic kidney disease)   . Coronary artery disease   . Hyperlipidemia   . Hypertension   . Mesenteric ischemia ([place])   . Pancreatic cyst   . Pericardial effusion   . Peripheral polyneuropathy   . Psoriasis   . Renal insufficiency   . Restless leg syndrome   . SVT (supraventricular tachycardia) ([place])   . Thyroid disease   . Transient ischemic attack (TIA)    Past Surgical History:  Procedure Laterality Date  . ABDOMINAL HYSTERECTOMY  1975  . ADRENALECTOMY Right    with radiation  . mesenteric ischemia stents    . URETERAL STENT PLACEMENT       Medications:  Scheduled: . amLODipine  10 mg Oral Daily  . aspirin EC  81 mg Oral Daily  . clopidogrel  75 mg Oral Q breakfast  . docusate sodium  100 mg Oral BID  . enoxaparin (LOVENOX) injection  30 mg Subcutaneous Q24H  . hydrALAZINE  100 mg Oral Q8H  . levothyroxine  112 mcg Oral QAC breakfast  . pravastatin  40 mg Oral q1800   Continuous: . cefTRIAXone (ROCEPHIN)  IV Stopped (12/20/17 2104)    Allergies:  Allergies  Allergen Reactions  . Amoxicillin     Other reaction(s): Other (See Comments) Liver infection per pt report Caused liver infection   . Clonidine Anaphylaxis  . Sulfa Antibiotics Nausea Only  . Sulfamethoxazole Nausea And Vomiting  . Clonidine Derivatives   . Metoprolol     Other reaction(s): Dizziness (intolerance) syncope  History reviewed. No pertinent family history.  Social History:  reports that she has never smoked. She has never used smokeless tobacco. She reports that she does not drink alcohol or use drugs.  Review of Systems (10 point): Pertinent items are noted in HPI. A comprehensive review of systems was negative except as noted above.  Physical Exam:  Vital signs in last 24 hours: Temp:  [98.3 F (36.8 C)-99.7 F (37.6 C)] 99.3 F (37.4 C) (08/08 1411) Pulse Rate:  [80-99] 99 (08/08 1421) Resp:  [16-20] 20 (08/08 0504) BP: (184-235)/(54-108) 235/72 (08/08 1715) SpO2:  [82 %-97 %] 82 % (08/08 1421) General appearance: alert and appears stated age Head: Normocephalic, without obvious abnormality, atraumatic Eyes: conjunctivae/corneas clear. EOM's intact.  Oropharynx: moist mucous membranes Neck: supple, symmetrical, trachea midline Resp: normal respiratory effort Cardio: regular rate and rhythm Back: symmetric, no curvature. ROM normal.  Minimal CVA tenderness. GI: soft, she is tender in the right upper quadrant; bowel sounds normal; no masses,  no organomegaly Extremities: extremities normal, atraumatic, no cyanosis or  edema Skin: Skin color normal. No visible rashes or lesions Neurologic: Grossly normal  Laboratory Data:  Recent Labs    12/20/17 0913 12/21/17 0424  WBC 21.9* 22.0*  HGB 11.7* 10.6*  HCT 34.9* 33.7*   BMET Recent Labs    12/21/17 0424 12/21/17 1344  NA 144 139  K 6.1* 4.9  CL 112* 108  CO2 25 24  GLUCOSE 118* 131*  BUN 23 23  CREATININE 1.26* 1.20*  CALCIUM 8.9 8.8*   No results for input(s): LABPT, INR in the last 72 hours. No results for input(s): LABURIN in the last 72 hours. Results for orders placed or performed during the hospital encounter of 11/03/17  Urine culture     Status: Abnormal   Collection Time: 11/03/17 12:02 PM  Result Value Ref Range Status   Specimen Description   Final    URINE, CLEAN CATCH Performed at [facility], [address] [place],  [address]    Special Requests   Final    NONE Performed at [facility], [address] [place], [place] [address]    Culture >=100,000 COLONIES/mL KLEBSIELLA PNEUMONIAE (A)  Final   Report Status 11/06/2017 FINAL  Final   Organism ID, Bacteria KLEBSIELLA PNEUMONIAE (A)  Final      Susceptibility   Klebsiella pneumoniae - MIC*    AMPICILLIN RESISTANT Resistant     CEFAZOLIN <=4 SENSITIVE Sensitive     CEFTRIAXONE <=1 SENSITIVE Sensitive     CIPROFLOXACIN <=0.25 SENSITIVE Sensitive     GENTAMICIN <=1 SENSITIVE Sensitive     IMIPENEM <=0.25 SENSITIVE Sensitive     NITROFURANTOIN 64 INTERMEDIATE Intermediate     TRIMETH/SULFA <=20 SENSITIVE Sensitive     AMPICILLIN/SULBACTAM <=2 SENSITIVE Sensitive     PIP/TAZO <=4 SENSITIVE Sensitive     Extended ESBL NEGATIVE Sensitive     * >=100,000 COLONIES/mL KLEBSIELLA PNEUMONIAE   Creatinine: Recent Labs    12/20/17 0913 12/21/17 0424 12/21/17 1344  CREATININE 1.04* 1.26* 1.20*    Imaging: Ct Abdomen Pelvis W Contrast  Result Date: 12/20/2017 CLINICAL DATA:  Abdominal pain, nausea vomiting since yesterday EXAM: CT  ABDOMEN AND PELVIS WITH CONTRAST TECHNIQUE: Multidetector CT imaging of the abdomen and pelvis was performed using the standard protocol following bolus administration of intravenous contrast. CONTRAST:  136mL ISOVUE-300 IOPAMIDOL (ISOVUE-300) INJECTION 61% COMPARISON:  November 03 2017 FINDINGS: Lower chest: Mild atelectasis of bilateral lung  bases are noted. The heart size is mildly enlarged. There is a small pericardial effusion. Hepatobiliary: No focal liver lesion is identified. Patient status post prior cholecystectomy. Intra and extrahepatic biliary ductal dilatation are identified unchanged. Pancreas: There is a 2.2 x 2.6 cm cyst in the tail of the pancreas. A second 0.8 cm cyst is identified in the tail of the pancreas likely present on the prior CT. The pancreas is otherwise unremarkable. Spleen: Normal in size without focal abnormality. Adrenals/Urinary Tract: The left adrenal gland is normal. There is a 1.5 x 1.8 cm mixed cystic and solid enhancing mass in the anterior midpole left kidney. There is no left hydronephrosis. The patient status post prior right adrenal resection. There is atrophic right kidney with significant hydronephrosis and minimal proximal hydroureter though the majority of the right ureter is normal caliber. This is unchanged compared prior CT. There is right perinephric edema. There are 2 focus of air within the bladder lumen question recent instrumentation. Stomach/Bowel: The stomach is normal. There is no small bowel obstruction. There is mild diffuse bowel wall thickening of the left-sided transverse colon which is under distended. There is diverticulosis of colon. The appendix is not seen but no inflammation is noted surrounding the cecum. Vascular/Lymphatic: Marked aortic atherosclerosis. No enlarged abdominal or pelvic lymph nodes. Reproductive: Status post hysterectomy. No adnexal masses. Other: None. Musculoskeletal: Degenerative joint changes of the spine are noted. IMPRESSION:  Diffuse bowel wall thickening of the left side transverse colon. This can be seen in colitis. No small bowel obstruction. Mixed cystic and solid enhancing mass in the midpole left kidney. Renal cell carcinoma not excluded. Further evaluation MR of kidneys is recommended. Cysts in the tail of pancreas not significantly changed compared to prior CT. Atrophic right kidney with significant right hydronephrosis and minimal proximal hydroureter not significantly changed compared prior CT. Electronically Signed   By: Abelardo Diesel M.D.   On: 12/20/2017 11:29   Dg Chest Port 1 View  Result Date: 12/21/2017 CLINICAL DATA:  Shortness of breath. EXAM: PORTABLE CHEST 1 VIEW COMPARISON:  Chest x-ray dated November 03, 2017. FINDINGS: Stable cardiomegaly. Normal pulmonary vascularity. Atherosclerotic calcification of the aortic arch. Mild bibasilar atelectasis. No focal consolidation, pleural effusion, or pneumothorax. No acute osseous abnormality. IMPRESSION: Mild bibasilar atelectasis.  No active disease. Electronically Signed   By: Titus Dubin M.D.   On: 12/21/2017 16:17       Brooke Walker C 12/21/2017, 5:30 PM

## 2017-12-21 NOTE — Progress Notes (Signed)
Pt sats decreased to 70% on RA, alert and responsive, colored slightly mottled, oxygen started at 3 liters sats 82-83% slowly increasing, increased to 4 liters sats 85%, color slightly pale, pt oxygen increased to 6 liters sats then 89-90%. Pt awake and talking at intervals. MD made of status change and request pt to transfer to [place]. SRP, RN

## 2017-12-21 NOTE — Progress Notes (Signed)
Assumed care over patient. Agree with previous RN assessment. Will continue to monitor. 

## 2017-12-21 NOTE — Progress Notes (Addendum)
PROGRESS NOTE  Brooke Walker ZOX:[id number] DOB: 28-Jun-1930 DOA: 12/20/2017 PCP: [name] I, MD  HPI/Recap of past 24 hours: Brooke Walker is a 84 y.o. female with medical history significant of TIA; mesenteric ischemia; HTN; HLD; CAD; and R adrenal cancer presenting with right flank pain.  She has been having extreme pain along the right flank 1 day duration.  Associated nausea and vomiting. UA is c/w UTI. WBC 20k. CT with severe R hydronephrosis and evidence for pyelonephritis.    12/21/2017: Patient seen and examined with daughter and daughter-in-law at bedside.  She reports persistent nausea requiring IV antiemetic and severe right flank pain requiring IV pain medications.  Consulted urology Dr. Karsten Ro will see in consultation.    Assessment/Plan: Principal Problem:   Pyelonephritis Active Problems:   Hyperglycemia   Essential hypertension   Left renal mass   Hydronephrosis of right kidney   Arrhythmia   Pancreatic cyst  Acute Right pyelonephritis complicated by hydronephrosis Urine culture pending Obtain blood cultures x2 peripherally Continue ceftriaxone empirically Continue to monitor fever curve Continue to monitor WBC which has trended up since her admission Urology consulted.  Highly appreciated. Patient has a urologist she sees at [facility] as needed for nausea Continue IV morphine as needed for severe pain  Left renal mass Per patient it is not new was diagnosed at [facility] previously Follow with oncology outpatient  Pancreatic cysts No significant changes compared to prior CT per radiology  Diffuse bowel wall thickening of left side transverse colon Incidentally found on CT abdomen and pelvis with contrast done on 12/20/2017 Does not appear to have left sided abdominal pain on palpation during physical exam Continue to monitor  History of right adrenal carcinoma Will need to follow-up with her oncologist  outpatient  Hyperkalemia Potassium 6.1 Received calcium gluconate IV Kayexalate initially held due to nausea/vomiting and replaced by IV Lasix 40 given once Repeat BMP this afternoon  CKD 3 Creatinine at baseline 1.04 with GFR 48 Creatinine 1.26 Avoid nephrotoxic agents  Questionable small pericardial effusion Found on CT abdomen pelvis with contrast We will obtain 2D echo   Code Status: DNR  Family Communication: Daughter and daughter-in-law at bedside.  All questions answered to their satisfaction.  Disposition Plan: Home in 1 to 2 days when able to tolerate p.o. And urology signs off.   Consultants:  Urology  Procedures:  None  Antimicrobials:  Ceftriaxone  DVT prophylaxis: Subcu Lovenox daily   Objective: Vitals:   12/21/17 0122 12/21/17 0437 12/21/17 0504 12/21/17 0617  BP: (!) 199/57 (!) 200/54 (!) 202/60 (!) 184/54  Pulse: 88 87 92 81  Resp:  18 20   Temp:  98.5 F (36.9 C) 99.7 F (37.6 C)   TempSrc:   Oral   SpO2:  94% 93%   Weight:      Height:        Intake/Output Summary (Last 24 hours) at 12/21/2017 1253 Last data filed at 12/21/2017 1048 Gross per 24 hour  Intake 1661.58 ml  Output -  Net 1661.58 ml   Filed Weights   12/20/17 0850  Weight: 54.4 kg    Exam:  . General: 84 y.o. year-old female well developed well nourished appears uncomfortable due to severe flank pain.  Alert and oriented x3. . Cardiovascular: Regular rate and rhythm with no rubs or gallops.  No thyromegaly or JVD noted.   Marland Kitchen Respiratory: Clear to auscultation with no wheezes or rales. Good inspiratory effort. . Abdomen:  Mildly distended normal bowel sounds x4 quadrants.  Mild tenderness with palpation  on the right lower quadrant. . Musculoskeletal: No lower extremity edema. 2/4 pulses in all 4 extremities. . Skin: No ulcerative lesions noted or rashes . Psychiatry: Mood is appropriate for condition and setting   Data Reviewed: CBC: Recent Labs  Lab  12/20/17 0913 12/21/17 0424  WBC 21.9* 22.0*  NEUTROABS 20.0*  --   HGB 11.7* 10.6*  HCT 34.9* 33.7*  MCV 95.6 98.8  PLT 166 098*   Basic Metabolic Panel: Recent Labs  Lab 12/20/17 0913 12/21/17 0424  NA 137 144  K 4.3 6.1*  CL 107 112*  CO2 19* 25  GLUCOSE 164* 118*  BUN 30* 23  CREATININE 1.04* 1.26*  CALCIUM 9.3 8.9   GFR: Estimated Creatinine Clearance: 28.5 mL/min (A) (by C-G formula based on SCr of 1.26 mg/dL (H)). Liver Function Tests: Recent Labs  Lab 12/20/17 0913  AST 39  ALT 23  ALKPHOS 67  BILITOT 0.7  PROT 7.5  ALBUMIN 4.3   Recent Labs  Lab 12/20/17 0913  LIPASE 22   No results for input(s): AMMONIA in the last 168 hours. Coagulation Profile: No results for input(s): INR, PROTIME in the last 168 hours. Cardiac Enzymes: No results for input(s): CKTOTAL, CKMB, CKMBINDEX, TROPONINI in the last 168 hours. BNP (last 3 results) No results for input(s): PROBNP in the last 8760 hours. HbA1C: No results for input(s): HGBA1C in the last 72 hours. CBG: No results for input(s): GLUCAP in the last 168 hours. Lipid Profile: No results for input(s): CHOL, HDL, LDLCALC, TRIG, CHOLHDL, LDLDIRECT in the last 72 hours. Thyroid Function Tests: Recent Labs    12/20/17 1819  TSH 6.861*  FREET4 0.81*   Anemia Panel: No results for input(s): VITAMINB12, FOLATE, FERRITIN, TIBC, IRON, RETICCTPCT in the last 72 hours. Urine analysis:    Component Value Date/Time   COLORURINE YELLOW 12/20/2017 0901   APPEARANCEUR HAZY (A) 12/20/2017 0901   LABSPEC 1.010 12/20/2017 0901   PHURINE 6.0 12/20/2017 0901   GLUCOSEU NEGATIVE 12/20/2017 0901   HGBUR NEGATIVE 12/20/2017 0901   BILIRUBINUR NEGATIVE 12/20/2017 0901   KETONESUR NEGATIVE 12/20/2017 0901   PROTEINUR 100 (A) 12/20/2017 0901   NITRITE POSITIVE (A) 12/20/2017 0901   LEUKOCYTESUR NEGATIVE 12/20/2017 0901   Sepsis Labs: @LABRCNTIP (procalcitonin:4,lacticidven:4)  )No results found for this or any  previous visit (from the past 240 hour(s)).    Studies: No results found.  Scheduled Meds: . amLODipine  10 mg Oral Daily  . aspirin EC  81 mg Oral Daily  . clopidogrel  75 mg Oral Q breakfast  . docusate sodium  100 mg Oral BID  . enoxaparin (LOVENOX) injection  30 mg Subcutaneous Q24H  . hydrALAZINE  100 mg Oral Q8H  . levothyroxine  112 mcg Oral QAC breakfast  . pravastatin  40 mg Oral q1800    Continuous Infusions: . sodium chloride 75 mL/hr at 12/21/17 0848  . cefTRIAXone (ROCEPHIN)  IV Stopped (12/20/17 2104)     LOS: 0 days     Kayleen Memos, MD [facility] Pager [phone]  If 7PM-7AM, please contact night-coverage [email or web address] Password [facility] 12/21/2017, 12:53 PM

## 2017-12-22 ENCOUNTER — Inpatient Hospital Stay (HOSPITAL_COMMUNITY): Payer: Medicare Other | Admitting: Anesthesiology

## 2017-12-22 ENCOUNTER — Inpatient Hospital Stay (HOSPITAL_COMMUNITY): Payer: Medicare Other

## 2017-12-22 ENCOUNTER — Encounter (HOSPITAL_COMMUNITY)
Admission: EM | Disposition: A | Discharge status: Skilled Nursing Facility | Payer: Self-pay | Source: Home / Self Care | Attending: Internal Medicine

## 2017-12-22 DIAGNOSIS — I340 Nonrheumatic mitral (valve) insufficiency: Secondary | ICD-10-CM

## 2017-12-22 HISTORY — PX: CYSTOSCOPY WITH RETROGRADE PYELOGRAM, URETEROSCOPY AND STENT PLACEMENT: SHX5789

## 2017-12-22 LAB — COMPREHENSIVE METABOLIC PANEL
ALBUMIN: 2.8 g/dL — AB (ref 3.5–5.0)
ALK PHOS: 60 U/L (ref 38–126)
ALT: 14 U/L (ref 0–44)
ANION GAP: 5 (ref 5–15)
AST: 19 U/L (ref 15–41)
BUN: 24 mg/dL — ABNORMAL HIGH (ref 8–23)
CALCIUM: 8.4 mg/dL — AB (ref 8.9–10.3)
CO2: 24 mmol/L (ref 22–32)
Chloride: 106 mmol/L (ref 98–111)
Creatinine, Ser: 1.28 mg/dL — ABNORMAL HIGH (ref 0.44–1.00)
GFR calc non Af Amer: 37 mL/min — ABNORMAL LOW (ref >60)
GFR, EST AFRICAN AMERICAN: 43 mL/min — AB (ref >60)
GLUCOSE: 112 mg/dL — AB (ref 70–99)
Potassium: 4.6 mmol/L (ref 3.5–5.1)
SODIUM: 135 mmol/L (ref 135–145)
Total Bilirubin: 0.7 mg/dL (ref 0.3–1.2)
Total Protein: 5.8 g/dL — ABNORMAL LOW (ref 6.5–8.1)

## 2017-12-22 LAB — CBC
HCT: 30.3 % — ABNORMAL LOW (ref 36.0–46.0)
HEMOGLOBIN: 10 g/dL — AB (ref 12.0–15.0)
MCH: 31.7 pg (ref 26.0–34.0)
MCHC: 33 g/dL (ref 30.0–36.0)
MCV: 96.2 fL (ref 78.0–100.0)
PLATELETS: 113 10*3/uL — AB (ref 150–400)
RBC: 3.15 MIL/uL — ABNORMAL LOW (ref 3.87–5.11)
RDW: 14.3 % (ref 11.5–15.5)
WBC: 16.9 10*3/uL — ABNORMAL HIGH (ref 4.0–10.5)

## 2017-12-22 LAB — ECHOCARDIOGRAM COMPLETE
Height: 64 in
Weight: 1920 oz

## 2017-12-22 SURGERY — CYSTOURETEROSCOPY, WITH RETROGRADE PYELOGRAM AND STENT INSERTION
Anesthesia: General | Laterality: Right

## 2017-12-22 MED ORDER — LABETALOL HCL 5 MG/ML IV SOLN
5.0000 mg | INTRAVENOUS | Status: DC | PRN
  Administered 2017-12-22: 5 mg via INTRAVENOUS

## 2017-12-22 MED ORDER — ORAL CARE MOUTH RINSE
15.0000 mL | Freq: Two times a day (BID) | OROMUCOSAL | Status: DC
  Administered 2017-12-22 – 2017-12-25 (×6): 15 mL via OROMUCOSAL

## 2017-12-22 MED ORDER — ONDANSETRON HCL 4 MG/2ML IJ SOLN
INTRAMUSCULAR | Status: DC | PRN
  Administered 2017-12-22: 4 mg via INTRAVENOUS

## 2017-12-22 MED ORDER — PROPOFOL 10 MG/ML IV BOLUS
INTRAVENOUS | Status: AC
Start: 2017-12-22 — End: ?
  Filled 2017-12-22: qty 20

## 2017-12-22 MED ORDER — HYDROCODONE-ACETAMINOPHEN 5-325 MG PO TABS
1.0000 | ORAL_TABLET | Freq: Four times a day (QID) | ORAL | Status: DC | PRN
  Administered 2017-12-23 – 2017-12-24 (×5): 2 via ORAL
  Administered 2017-12-25: 1 via ORAL
  Filled 2017-12-22 (×5): qty 2
  Filled 2017-12-22: qty 1
  Filled 2017-12-22 (×2): qty 2

## 2017-12-22 MED ORDER — SODIUM CHLORIDE 0.9 % IR SOLN
Status: DC | PRN
  Administered 2017-12-22: 3000 mL via INTRAVESICAL

## 2017-12-22 MED ORDER — HYDROCODONE-ACETAMINOPHEN 5-325 MG PO TABS
2.0000 | ORAL_TABLET | Freq: Once | ORAL | Status: AC
  Administered 2017-12-22: 2 via ORAL
  Filled 2017-12-22: qty 2

## 2017-12-22 MED ORDER — FENTANYL CITRATE (PF) 100 MCG/2ML IJ SOLN
INTRAMUSCULAR | Status: AC
  Filled 2017-12-22: qty 2

## 2017-12-22 MED ORDER — FENTANYL CITRATE (PF) 100 MCG/2ML IJ SOLN
INTRAMUSCULAR | Status: DC | PRN
  Administered 2017-12-22 (×2): 25 ug via INTRAVENOUS

## 2017-12-22 MED ORDER — LIDOCAINE 2% (20 MG/ML) 5 ML SYRINGE
INTRAMUSCULAR | Status: DC | PRN
  Administered 2017-12-22: 75 mg via INTRAVENOUS

## 2017-12-22 MED ORDER — PROMETHAZINE HCL 25 MG/ML IJ SOLN: 6.2500 mg | INTRAMUSCULAR | Status: DC | PRN

## 2017-12-22 MED ORDER — LACTATED RINGERS IV SOLN
INTRAVENOUS | Status: DC
  Administered 2017-12-22: 12:00:00 via INTRAVENOUS

## 2017-12-22 MED ORDER — FENTANYL CITRATE (PF) 100 MCG/2ML IJ SOLN: 25.0000 ug | INTRAMUSCULAR | Status: DC | PRN

## 2017-12-22 MED ORDER — LABETALOL HCL 5 MG/ML IV SOLN
INTRAVENOUS | Status: AC
  Filled 2017-12-22: qty 4

## 2017-12-22 MED ORDER — MORPHINE SULFATE (PF) 2 MG/ML IV SOLN
1.0000 mg | Freq: Once | INTRAVENOUS | Status: AC
  Administered 2017-12-22: 1 mg via INTRAVENOUS
  Filled 2017-12-22: qty 1

## 2017-12-22 MED ORDER — PROPOFOL 10 MG/ML IV BOLUS
INTRAVENOUS | Status: AC
  Filled 2017-12-22: qty 20

## 2017-12-22 MED ORDER — TRAMADOL HCL 50 MG PO TABS
50.0000 mg | ORAL_TABLET | Freq: Once | ORAL | Status: AC
  Administered 2017-12-22: 50 mg via ORAL
  Filled 2017-12-22: qty 1

## 2017-12-22 MED ORDER — PROPOFOL 10 MG/ML IV BOLUS
INTRAVENOUS | Status: DC | PRN
  Administered 2017-12-22: 130 mg via INTRAVENOUS

## 2017-12-22 MED ORDER — MIDAZOLAM HCL 2 MG/2ML IJ SOLN
INTRAMUSCULAR | Status: AC
  Filled 2017-12-22: qty 2

## 2017-12-22 MED ORDER — IOHEXOL 300 MG/ML  SOLN
INTRAMUSCULAR | Status: DC | PRN
  Administered 2017-12-22: 10 mL via URETHRAL

## 2017-12-22 SURGICAL SUPPLY — 19 items
BAG URO CATCHER STRL LF (MISCELLANEOUS) ×2 IMPLANT
BASKET ZERO TIP 1.9FR (BASKET) IMPLANT
BASKET ZERO TIP NITINOL 2.4FR (BASKET) IMPLANT
CATH INTERMIT  6FR 70CM (CATHETERS) ×2 IMPLANT
CLOTH BEACON ORANGE TIMEOUT ST (SAFETY) ×2 IMPLANT
EXTRACTOR STONE 1.7FRX115CM (UROLOGICAL SUPPLIES) IMPLANT
GLOVE BIOGEL M 8.0 STRL (GLOVE) ×4 IMPLANT
GOWN STRL REUS W/TWL XL LVL3 (GOWN DISPOSABLE) ×4 IMPLANT
GUIDEWIRE ANG ZIPWIRE 038X150 (WIRE) ×2 IMPLANT
GUIDEWIRE STR DUAL SENSOR (WIRE) ×2 IMPLANT
IV NS 1000ML (IV SOLUTION) ×1
IV NS 1000ML BAXH (IV SOLUTION) ×1 IMPLANT
MANIFOLD NEPTUNE II (INSTRUMENTS) ×2 IMPLANT
PACK CYSTO (CUSTOM PROCEDURE TRAY) ×2 IMPLANT
SHEATH URETERAL 12FRX28CM (UROLOGICAL SUPPLIES) IMPLANT
SHEATH URETERAL 12FRX35CM (MISCELLANEOUS) IMPLANT
STENT CONTOUR 6FRX26X.038 (STENTS) ×2 IMPLANT
TUBING CONNECTING 10 (TUBING) ×2 IMPLANT
TUBING UROLOGY SET (TUBING) ×2 IMPLANT

## 2017-12-22 NOTE — Op Note (Signed)
PATIENT:  Mee Hives  PRE-OPERATIVE DIAGNOSIS: Right hydronephrosis and flank pain  POST-OPERATIVE DIAGNOSIS: 1.  Right hydronephrosis with pyonephrosis 2.  Right panurethral stricture  PROCEDURE: 1.  Cystoscopy with right retrograde pyelogram including interpretation. 2.  Right double-J stent placement  SURGEON:  Claybon Jabs  INDICATION: Nicki Furlan is a 84 year old female with marked right hydronephrosis and low-grade fever with persistent right flank pain.  We discussed options and at first she was improving clinically but developed worsening flank pain with low-grade fever and therefore I have recommended evaluation with a retrograde pyelogram and decompression with the double-J stent placement  ANESTHESIA:  General  EBL:  Minimal  DRAINS: 6 French, 26 cm double-J stent in the right ureter (no string)  LOCAL MEDICATIONS USED:  None  SPECIMEN: Urine from right renal pelvis for Gram stain and urine culture.  Description of procedure: After informed consent the patient was taken to the operating room and placed on the table in a supine position. General anesthesia was then administered. Once fully anesthetized the patient was moved to the dorsal lithotomy position and the genitalia were sterilely prepped and draped in standard fashion. An official timeout was then performed.  The 23 French cystoscope was passed into the bladder with a 30 degree lens and the bladder was fully and systematically inspected.  It was noted to be entirely normal with normal-appearing mucosa.  The right and left ureteral orifice were noted to be normal as well.  Right retrograde pyelogram was performed by passing a 6 [place] open-ended ureteral catheter through the cystoscope and into the right ureteral orifice full-strength Omnipaque contrast was then injected under direct fluoroscopy which revealed a narrow ureter that appeared slightly irregular throughout its entire length.  No definite intraluminal  filling defect could be identified.  At approximately the ureteropelvic junction contrast entered the collecting system which was dilated.  A 0.038 inch sensor guidewire was passed through the open-ended catheter and into the area the renal pelvis without difficulty.  The open-ended cath was then passed over the guidewire into the renal pelvis and the guidewire was removed.  Urine was obtained which appeared to be pure pus.  This was sent for culture.  Due to this finding I elected to proceed with stent placement only without further evaluation of the ureter.  The guidewire was then passed through the open-ended catheter and the open-ended catheter was removed and the stent was passed over the guidewire into the renal pelvis.  As the guidewire was removed good curl was noted in the renal pelvis.  I then observed the stent and noted purulent material exiting from the stent into the bladder.  The bladder was drained, the cystoscope was removed patient was awakened and taken to the recovery room in stable and satisfactory condition.  She tolerated the procedure well with no complications.  PATIENT DISPOSITION:  PACU - hemodynamically stable.

## 2017-12-22 NOTE — Progress Notes (Addendum)
PROGRESS NOTE  Brooke Walker ZDG:[id number] DOB: 30-Jan-1931 DOA: 12/20/2017 PCP: [name] I, MD  HPI/Recap of past 24 hours: Brooke Walker is a 84 y.o. female with medical history significant of TIA; mesenteric ischemia; HTN; HLD; CAD; and R adrenal cancer presenting with right flank pain.  She has been having extreme pain along the right flank 1 day duration.  Associated nausea and vomiting. UA is c/w UTI. WBC 20k. CT with severe R hydronephrosis and evidence for pyelonephritis.    12/21/2017: Patient seen and examined with daughter and daughter-in-law at bedside.  She reports persistent nausea requiring IV antiemetic and severe right flank pain requiring IV pain medications.  Consulted urology Dr. Karsten Ro will see in consultation.  12/22/2017: Seen and examined at bedside.  Pain is well controlled on current pain management.  Endoscopy with right retrograde pyelogram/right double-J stent placement plan today.  Assessment/Plan: Principal Problem:   Pyelonephritis Active Problems:   Hyperglycemia   Essential hypertension   Left renal mass   Hydronephrosis of right kidney   Arrhythmia   Pancreatic cyst  Acute Right pyelonephritis complicated by hydronephrosis Urine culture greater than 100,000 colonies of E. Coli-awaiting sensitivities Cultures x2 negative to date Continue ceftriaxone empirically Urology following with possible stent placement today  Accelerated hypertension On highest dose of amlodipine and hydralazine Continue to hold losartan due to increasing creatinine IV hydralazine as needed 10 mg every 6 hours for systolic blood pressure greater than 951 or diastolic less than 884  Left renal mass Per patient it is not new was diagnosed at [facility] previously Follow with oncology outpatient  Pancreatic cysts No significant changes compared to prior CT per radiology  Diffuse bowel wall thickening of left side transverse colon Incidentally found on CT abdomen and  pelvis with contrast done on 12/20/2017 Does not appear to have left sided abdominal pain on palpation during physical exam Continue to monitor  History of right adrenal carcinoma Will need to follow-up with her oncologist outpatient  Hyperkalemia, resolved Treated Potassium 6.1>> 4.6 Repeat BMP in the morning  CKD 3 Creatinine at baseline 1.04 with GFR 48 Creatinine 1.26 Avoid nephrotoxic agents  Small pericardial effusion Confirmed on 2D echo No wall motion abnormalities and normal LVEF   Code Status: DNR  Family Communication: Daughter and daughter-in-law at bedside 12/21/2017.  All questions answered to their satisfaction.  Disposition Plan: Home possibly tomorrow 12/23/2017 or when urology signs off.  Consultants:  Urology  Procedures:  Cystoscopy with retrograde and possible right ureteral-stent placement  Antimicrobials:  Ceftriaxone  DVT prophylaxis: Subcu Lovenox daily   Objective: Vitals:   12/22/17 1400 12/22/17 1415 12/22/17 1430 12/22/17 1442  BP: (!) 175/54 (!) 182/44 (!) 202/40 (!) 198/33  Pulse: 82 86 83   Resp: 19 14 16    Temp:  98.2 F (36.8 C) 98 F (36.7 C)   TempSrc:      SpO2: 91% 90%    Weight:      Height:        Intake/Output Summary (Last 24 hours) at 12/22/2017 1630 Last data filed at 12/22/2017 1415 Gross per 24 hour  Intake 698.3 ml  Output 0 ml  Net 698.3 ml   Filed Weights   12/20/17 0850  Weight: 54.4 kg    Exam:  . General: 84 y.o. year-old female developed well-nourished in no acute distress.  Alert oriented x3. . Cardiovascular: Rate and rhythm with no rubs or gallops.  No thyromegaly or JVD noted.   Marland Kitchen Respiratory: Clear  to auscultation with no wheezes or rales. Good inspiratory effort. . Abdomen: No tenderness on palpation with normal bowel sounds x4 quadrant.   . Musculoskeletal: No lower extremity edema. 2/4 pulses in all 4 extremities. . Skin: No ulcerative lesions noted or rashes . Psychiatry: Mood is  appropriate for condition and setting   Data Reviewed: CBC: Recent Labs  Lab 12/20/17 0913 12/21/17 0424 12/22/17 0359  WBC 21.9* 22.0* 16.9*  NEUTROABS 20.0*  --   --   HGB 11.7* 10.6* 10.0*  HCT 34.9* 33.7* 30.3*  MCV 95.6 98.8 96.2  PLT 166 145* 259*   Basic Metabolic Panel: Recent Labs  Lab 12/20/17 0913 12/21/17 0424 12/21/17 1344 12/22/17 0359  NA 137 144 139 135  K 4.3 6.1* 4.9 4.6  CL 107 112* 108 106  CO2 19* 25 24 24   GLUCOSE 164* 118* 131* 112*  BUN 30* 23 23 24*  CREATININE 1.04* 1.26* 1.20* 1.28*  CALCIUM 9.3 8.9 8.8* 8.4*   GFR: Estimated Creatinine Clearance: 28.1 mL/min (A) (by C-G formula based on SCr of 1.28 mg/dL (H)). Liver Function Tests: Recent Labs  Lab 12/20/17 0913 12/22/17 0359  AST 39 19  ALT 23 14  ALKPHOS 67 60  BILITOT 0.7 0.7  PROT 7.5 5.8*  ALBUMIN 4.3 2.8*   Recent Labs  Lab 12/20/17 0913  LIPASE 22   No results for input(s): AMMONIA in the last 168 hours. Coagulation Profile: No results for input(s): INR, PROTIME in the last 168 hours. Cardiac Enzymes: No results for input(s): CKTOTAL, CKMB, CKMBINDEX, TROPONINI in the last 168 hours. BNP (last 3 results) No results for input(s): PROBNP in the last 8760 hours. HbA1C: No results for input(s): HGBA1C in the last 72 hours. CBG: No results for input(s): GLUCAP in the last 168 hours. Lipid Profile: No results for input(s): CHOL, HDL, LDLCALC, TRIG, CHOLHDL, LDLDIRECT in the last 72 hours. Thyroid Function Tests: Recent Labs    12/20/17 1819  TSH 6.861*  FREET4 0.81*   Anemia Panel: No results for input(s): VITAMINB12, FOLATE, FERRITIN, TIBC, IRON, RETICCTPCT in the last 72 hours. Urine analysis:    Component Value Date/Time   COLORURINE YELLOW 12/20/2017 0901   APPEARANCEUR HAZY (A) 12/20/2017 0901   LABSPEC 1.010 12/20/2017 0901   PHURINE 6.0 12/20/2017 0901   GLUCOSEU NEGATIVE 12/20/2017 0901   HGBUR NEGATIVE 12/20/2017 0901   BILIRUBINUR NEGATIVE  12/20/2017 0901   KETONESUR NEGATIVE 12/20/2017 0901   PROTEINUR 100 (A) 12/20/2017 0901   NITRITE POSITIVE (A) 12/20/2017 0901   LEUKOCYTESUR NEGATIVE 12/20/2017 0901   Sepsis Labs: @LABRCNTIP (procalcitonin:4,lacticidven:4)  ) Recent Results (from the past 240 hour(s))  Urine culture     Status: Abnormal (Preliminary result)   Collection Time: 12/20/17  9:01 AM  Result Value Ref Range Status   Specimen Description   Final    URINE, CLEAN CATCH Performed at [facility], [address] [place], [place] [address]    Special Requests   Final    NONE Performed at [facility], [address] [place], [place] [address]    Culture >=100,000 COLONIES/mL ESCHERICHIA COLI (A)  Final   Report Status PENDING  Incomplete  Culture, blood (routine x 2)     Status: None (Preliminary result)   Collection Time: 12/21/17  1:44 PM  Result Value Ref Range Status   Specimen Description   Final    BLOOD LEFT ANTECUBITAL Performed at [facility], [address]  [place], [place] [address]    Special Requests   Final    BOTTLES DRAWN AEROBIC AND ANAEROBIC Blood Culture adequate volume Performed at [place] [address], [place], [place] [address]    Culture   Final    NO GROWTH < 24 HOURS Performed at [facility] [address], [place], [place] [address]    Report Status PENDING  Incomplete  Culture, blood (routine x 2)     Status: None (Preliminary result)   Collection Time: 12/21/17  1:54 PM  Result Value Ref Range Status   Specimen Description   Final    BLOOD LEFT HAND Performed at [facility] [address], [place], [place] [address]    Special Requests   Final    BOTTLES DRAWN AEROBIC ONLY Blood Culture adequate volume Performed at [facility] [address] [place], [place] [address]    Culture   Final    NO GROWTH < 24 HOURS Performed at [facility] [address] [place], [place] [address]    Report Status PENDING  Incomplete  MRSA PCR Screening     Status: None   Collection Time: 12/21/17  4:42 PM  Result Value Ref Range Status   MRSA by PCR NEGATIVE NEGATIVE Final    Comment:        The GeneXpert MRSA Assay (FDA approved for NASAL specimens only), is one component of a comprehensive MRSA colonization surveillance program. It is not intended to diagnose MRSA infection nor to guide or monitor treatment for MRSA infections. Performed at [facility], [place] [address], [place], [place] [address]       Studies: Dg C-arm 1-60 Min-no Report  Result Date: 12/22/2017 Fluoroscopy was utilized by the requesting physician.  No radiographic interpretation.    Scheduled Meds: . amLODipine  10 mg Oral Daily  . aspirin EC  81 mg Oral Daily  . clopidogrel  75 mg Oral Q breakfast  . docusate sodium  100 mg Oral BID  . enoxaparin (LOVENOX) injection  30 mg Subcutaneous Q24H  . hydrALAZINE  100 mg Oral Q8H  . labetalol      . levothyroxine  112 mcg Oral QAC breakfast  . mouth rinse  15 mL Mouth Rinse BID  . pravastatin  40 mg Oral q1800    Continuous Infusions: . cefTRIAXone (ROCEPHIN)  IV Stopped (12/21/17 2232)     LOS: 1 day     Kayleen Memos, MD [facility] Pager [phone]  If 7PM-7AM, please contact night-coverage [email or web address] Password [email or web address] 12/22/2017, 4:30 PM

## 2017-12-22 NOTE — Progress Notes (Signed)
Patient ID: Brooke Walker, female   DOB: [birth year], 84 y.o.   MRN: [id number]    Assessment: 1.  Acute right flank pain: This had been improving since her admission when I saw her yesterday but last night her pain has worsened once again.  Although her white blood cell count has decreased slightly she has had a low-grade fever as well.  At this point it appears that drainage of her right kidney is going to be necessary.  2.  Right hydronephrosis:  I was able to review various other imaging studies done in the past and I do note that since 6/19 she has developed hydronephrosis that was not there on her previous scans.  Her previous scans did however reveal a very diminutive right kidney likely secondary to the effects of radiation.  I have discussed with the patient again today the diagnostic and therapeutic procedures in order to evaluate and manage her hydronephrosis and in my opinion I think an attempt at a retrograde stent placement should be undertaken as this will allow me to image the area of obstruction and also potentially allow direct visualization with the ureteroscope with biopsy if necessary. I have discussed this procedure with her in detail.  We discussed the risks and complications associated with the procedure as well as the fact that I may not be able to achieve access to the kidney and therefore would recommend placement of a percutaneous nephrostomy tube if I am unsuccessful.  We discussed the probability of success and the anticipated postoperative course.  Her questions were all answered to her satisfaction and she has elected to proceed.   Plan:  1.  She has been made n.p.o. in preparation for her surgery. 2.  I will take her to the OR this afternoon.   Subjective: Patient reports some worsening in her right flank pain.  Objective: Vital signs in last 24 hours: Temp:  [98.3 F (36.8 C)-100.3 F (37.9 C)] 98.3 F (36.8 C) (08/09 0400) Pulse Rate:  [80-106] 82 (08/09  0600) Resp:  [15-22] 22 (08/09 0600) BP: (149-235)/(29-108) 175/32 (08/09 0600) SpO2:  [82 %-96 %] 96 % (08/09 0600)A  Intake/Output from previous day: 08/08 0701 - 08/09 0700 In: 501.6 [P.O.:70; I.V.:333.3; IV Piggyback:98.3] Out: -  Intake/Output this shift: No intake/output data recorded.  Past Medical History:  Diagnosis Date  . Adrenal cancer, right ([place])   . AKI (acute kidney injury) ([place])   . Anemia   . CKD (chronic kidney disease)   . Coronary artery disease   . Hyperlipidemia   . Hypertension   . Mesenteric ischemia ([place])   . Pancreatic cyst   . Pericardial effusion   . Peripheral polyneuropathy   . Psoriasis   . Renal insufficiency   . Restless leg syndrome   . SVT (supraventricular tachycardia) ([place])   . Thyroid disease   . Transient ischemic attack (TIA)     Physical Exam:  General: Awake, alert and in no apparent distress Lungs: Normal respiratory effort, chest expands symmetrically.  Abdomen: Soft, tender in the right upper quadrant without peritoneal signs.  Back: Right CVAT.  Lab Results: Recent Labs    12/20/17 0913 12/21/17 0424 12/22/17 0359  WBC 21.9* 22.0* 16.9*  HGB 11.7* 10.6* 10.0*  HCT 34.9* 33.7* 30.3*   BMET Recent Labs    12/21/17 1344 12/22/17 0359  NA 139 135  K 4.9 4.6  CL 108 106  CO2 24 24  GLUCOSE 131* 112*  BUN 23 24*  CREATININE  1.20* 1.28*  CALCIUM 8.8* 8.4*   No results for input(s): LABURIN in the last 72 hours. Results for orders placed or performed during the hospital encounter of 12/20/17  Urine culture     Status: Abnormal (Preliminary result)   Collection Time: 12/20/17  9:01 AM  Result Value Ref Range Status   Specimen Description   Final    URINE, CLEAN CATCH Performed at [facility], [address] [place], [place] [address]    Special Requests   Final    NONE Performed at [facility], [address] [place], [place] [address]    Culture >=100,000 COLONIES/mL GRAM  NEGATIVE RODS (A)  Final   Report Status PENDING  Incomplete  MRSA PCR Screening     Status: None   Collection Time: 12/21/17  4:42 PM  Result Value Ref Range Status   MRSA by PCR NEGATIVE NEGATIVE Final    Comment:        The GeneXpert MRSA Assay (FDA approved for NASAL specimens only), is one component of a comprehensive MRSA colonization surveillance program. It is not intended to diagnose MRSA infection nor to guide or monitor treatment for MRSA infections. Performed at [facility], [place] [address], [place], [place] [address]     Studies/Results: Dg Chest Port 1 View  Result Date: 12/21/2017 CLINICAL DATA:  Shortness of breath. EXAM: PORTABLE CHEST 1 VIEW COMPARISON:  Chest x-ray dated November 03, 2017. FINDINGS: Stable cardiomegaly. Normal pulmonary vascularity. Atherosclerotic calcification of the aortic arch. Mild bibasilar atelectasis. No focal consolidation, pleural effusion, or pneumothorax. No acute osseous abnormality. IMPRESSION: Mild bibasilar atelectasis.  No active disease. Electronically Signed   By: Titus Dubin M.D.   On: 12/21/2017 16:17      Aleicia Kenagy C 12/22/2017, 7:33 AM

## 2017-12-22 NOTE — Care Management Note (Signed)
Case Management Note  Patient Details  Name: Brooke Walker MRN: [id number] Date of Birth: 1931-04-14  Subjective/Objective:          Rt. Hydronephrosis with blockage/for rt percutaneous nephrostomy drain today/ urine culture positive for e.coli/iv rocephin started.          Action/Plan: Lives aline with children and family near by/plans to return to home/ Expected Discharge Date:  12/22/17               Expected Discharge Plan:  Home/Self Care  In-House Referral:     Discharge planning Services  CM Consult  Post Acute Care Choice:    Choice offered to:     DME Arranged:    DME Agency:     HH Arranged:    [place] Agency:     Status of Service:  In process, will continue to follow  If discussed at Long Length of Stay Meetings, dates discussed:    Additional Comments:  Leeroy Cha, RN 12/22/2017, 10:52 AM

## 2017-12-22 NOTE — Anesthesia Preprocedure Evaluation (Addendum)
Anesthesia Evaluation  Patient identified by MRN, date of birth, ID band Patient awake    Reviewed: Allergy & Precautions, NPO status , Patient's Chart, lab work & pertinent test results  History of Anesthesia Complications Negative for: history of anesthetic complications  Airway Mallampati: II  TM Distance: >3 FB Neck ROM: Full    Dental no notable dental hx. (+) Dental Advisory Given   Pulmonary neg pulmonary ROS,    Pulmonary exam normal        Cardiovascular hypertension, Pt. on medications + Peripheral Vascular Disease  Normal cardiovascular exam  Impressions:  - Normal LV systolic function. No significant valvular disease.     Small pericardial effusion noted, but no evidence of hemodynamic   compromise; IVC not dilated and has normal respiratory change, no   significant change in mitral inflow velocities with respiration,   no RV collapse. RA walls not well visualized in subcostal views   but no clear collapse.     Elevated PA and RV systolic pressures with normal RA pressure.    Neuro/Psych TIA   GI/Hepatic negative GI ROS, Neg liver ROS,   Endo/Other  negative endocrine ROS  Renal/GU Renal InsufficiencyRenal disease     Musculoskeletal negative musculoskeletal ROS (+)   Abdominal   Peds  Hematology negative hematology ROS (+)   Anesthesia Other Findings Day of surgery medications reviewed with the patient.  Reproductive/Obstetrics                           Anesthesia Physical Anesthesia Plan  ASA: III  Anesthesia Plan: General   Post-op Pain Management:    Induction: Intravenous  PONV Risk Score and Plan: 3 and 4 or greater and Ondansetron, Dexamethasone, Diphenhydramine and Treatment may vary due to age or medical condition  Airway Management Planned: LMA and Oral ETT  Additional Equipment:   Intra-op Plan:   Post-operative Plan: Extubation in  OR  Informed Consent: I have reviewed the patients History and Physical, chart, labs and discussed the procedure including the risks, benefits and alternatives for the proposed anesthesia with the patient or authorized representative who has indicated his/her understanding and acceptance.   Dental advisory given  Plan Discussed with: CRNA and Anesthesiologist  Anesthesia Plan Comments:        Anesthesia Quick Evaluation

## 2017-12-22 NOTE — Anesthesia Procedure Notes (Signed)
Procedure Name: LMA Insertion Date/Time: 12/22/2017 12:41 PM Performed by: Lollie Sails, CRNA Pre-anesthesia Checklist: Patient identified, Emergency Drugs available, Suction available, Patient being monitored and Timeout performed Patient Re-evaluated:Patient Re-evaluated prior to induction Oxygen Delivery Method: Circle system utilized Preoxygenation: Pre-oxygenation with 100% oxygen Induction Type: IV induction Ventilation: Mask ventilation without difficulty LMA: LMA inserted LMA Size: 4.0 Number of attempts: 1 Placement Confirmation: positive ETCO2 and breath sounds checked- equal and bilateral Tube secured with: Tape Dental Injury: Teeth and Oropharynx as per pre-operative assessment

## 2017-12-22 NOTE — Transfer of Care (Signed)
Immediate Anesthesia Transfer of Care Note  Patient: Brooke Walker  Procedure(s) Performed: CYSTOSCOPY WITH RETROGRADE PYELOGRAM, AND STENT PLACEMENT (Right )  Patient Location: PACU  Anesthesia Type:General  Level of Consciousness: awake, sedated and drowsy  Airway & Oxygen Therapy: Patient Spontanous Breathing and Patient connected to face mask oxygen  Post-op Assessment: Report given to RN and Post -op Vital signs reviewed and stable  Post vital signs: stable  Last Vitals:  Vitals Value Taken Time  BP 196/64 12/22/2017  1:30 PM  Temp    Pulse 97 12/22/2017  1:32 PM  Resp 19 12/22/2017  1:32 PM  SpO2 100 % 12/22/2017  1:32 PM  Vitals shown include unvalidated device data.  Last Pain:  Vitals:   12/22/17 1315  TempSrc:   PainSc: Asleep      Patients Stated Pain Goal: 2 (55/00/16 4290)  Complications: No apparent anesthesia complications

## 2017-12-22 NOTE — Progress Notes (Signed)
  Echocardiogram 2D Echocardiogram has been performed.  Brooke Walker 12/22/2017, 9:02 AM

## 2017-12-23 DIAGNOSIS — R0902 Hypoxemia: Secondary | ICD-10-CM

## 2017-12-23 LAB — CBC
HEMATOCRIT: 32.5 % — AB (ref 36.0–46.0)
Hemoglobin: 10.5 g/dL — ABNORMAL LOW (ref 12.0–15.0)
MCH: 31.2 pg (ref 26.0–34.0)
MCHC: 32.3 g/dL (ref 30.0–36.0)
MCV: 96.4 fL (ref 78.0–100.0)
Platelets: 129 10*3/uL — ABNORMAL LOW (ref 150–400)
RBC: 3.37 MIL/uL — AB (ref 3.87–5.11)
RDW: 14.3 % (ref 11.5–15.5)
WBC: 15.6 10*3/uL — AB (ref 4.0–10.5)

## 2017-12-23 LAB — URINE CULTURE: Culture: >=100000 — AB

## 2017-12-23 LAB — BASIC METABOLIC PANEL
Anion gap: 8 (ref 5–15)
BUN: 33 mg/dL — ABNORMAL HIGH (ref 8–23)
CHLORIDE: 104 mmol/L (ref 98–111)
CO2: 23 mmol/L (ref 22–32)
Calcium: 8.3 mg/dL — ABNORMAL LOW (ref 8.9–10.3)
Creatinine, Ser: 1.18 mg/dL — ABNORMAL HIGH (ref 0.44–1.00)
GFR calc Af Amer: 48 mL/min — ABNORMAL LOW (ref >60)
GFR calc non Af Amer: 41 mL/min — ABNORMAL LOW (ref >60)
GLUCOSE: 110 mg/dL — AB (ref 70–99)
POTASSIUM: 4.3 mmol/L (ref 3.5–5.1)
Sodium: 135 mmol/L (ref 135–145)

## 2017-12-23 LAB — MAGNESIUM: Magnesium: 1.9 mg/dL (ref 1.7–2.4)

## 2017-12-23 MED ORDER — HYDRALAZINE HCL 20 MG/ML IJ SOLN
10.0000 mg | INTRAMUSCULAR | Status: DC | PRN
  Administered 2017-12-23 – 2017-12-24 (×4): 10 mg via INTRAVENOUS
  Filled 2017-12-23 (×5): qty 1

## 2017-12-23 MED ORDER — TERAZOSIN HCL 2 MG PO CAPS
2.0000 mg | ORAL_CAPSULE | Freq: Every day | ORAL | Status: DC
  Administered 2017-12-23: 2 mg via ORAL
  Filled 2017-12-23: qty 1

## 2017-12-23 MED ORDER — TERAZOSIN HCL 1 MG PO CAPS
1.0000 mg | ORAL_CAPSULE | Freq: Every day | ORAL | Status: DC
  Administered 2017-12-23: 1 mg via ORAL
  Filled 2017-12-23: qty 1

## 2017-12-23 MED ORDER — TERAZOSIN HCL 5 MG PO CAPS: 5.0000 mg | ORAL_CAPSULE | Freq: Every day | ORAL | Status: DC

## 2017-12-23 MED ORDER — TERAZOSIN HCL 5 MG PO CAPS
5.0000 mg | ORAL_CAPSULE | Freq: Every day | ORAL | Status: DC
  Administered 2017-12-24: 5 mg via ORAL
  Filled 2017-12-23 (×2): qty 1

## 2017-12-23 NOTE — Progress Notes (Signed)
1 Day Post-Op  Subjective: CC: Right flank pain.  Hx: Brooke Walker is POD #1 from a right ureteral stent for a right ureteral stricture with pyonephrosis.  She has some right sided pain and is voiding frequently but her WBC count is down some and she is afebrile.  ROS:  Review of Systems  Constitutional: Negative for fever.  Gastrointestinal: Negative for nausea.    Anti-infectives: Anti-infectives (From admission, onward)   Start     Dose/Rate Route Frequency Ordered Stop   12/20/17 2200  cefTRIAXone (ROCEPHIN) 1 g in sodium chloride 0.9 % 100 mL IVPB     1 g 200 mL/hr over 30 Minutes Intravenous Every 24 hours 12/20/17 1750     12/20/17 1000  ciprofloxacin (CIPRO) IVPB 400 mg     400 mg 200 mL/hr over 60 Minutes Intravenous  Once 12/20/17 0958 12/20/17 1320      Current Facility-Administered Medications  Medication Dose Route Frequency Provider Last Rate Last Dose  . acetaminophen (TYLENOL) tablet 650 mg  650 mg Oral Q6H PRN Karmen Bongo, MD   650 mg at 12/23/17 0549   Or  . acetaminophen (TYLENOL) suppository 650 mg  650 mg Rectal Q6H PRN Karmen Bongo, MD      . amLODipine (NORVASC) tablet 10 mg  10 mg Oral Daily Karmen Bongo, MD   10 mg at 12/22/17 1022  . aspirin EC tablet 81 mg  81 mg Oral Daily Karmen Bongo, MD      . cefTRIAXone (ROCEPHIN) 1 g in sodium chloride 0.9 % 100 mL IVPB  1 g Intravenous Q24H Karmen Bongo, MD   Stopped at 12/22/17 2145  . clopidogrel (PLAVIX) tablet 75 mg  75 mg Oral Q breakfast Karmen Bongo, MD   75 mg at 12/23/17 0814  . docusate sodium (COLACE) capsule 100 mg  100 mg Oral BID Karmen Bongo, MD   100 mg at 12/22/17 2118  . enoxaparin (LOVENOX) injection 30 mg  30 mg Subcutaneous Q24H Green, Terri L, RPH   30 mg at 12/22/17 2115  . hydrALAZINE (APRESOLINE) injection 10 mg  10 mg Intravenous Q6H PRN Irene Pap N, DO   10 mg at 12/23/17 0320  . hydrALAZINE (APRESOLINE) tablet 100 mg  100 mg Oral Q8H Karmen Bongo, MD   100 mg  at 12/23/17 0540  . HYDROcodone-acetaminophen (NORCO/VICODIN) 5-325 MG per tablet 1-2 tablet  1-2 tablet Oral Q6H PRN Schorr, Rhetta Mura, NP   2 tablet at 12/23/17 0320  . levothyroxine (SYNTHROID, LEVOTHROID) tablet 112 mcg  112 mcg Oral QAC breakfast Karmen Bongo, MD   112 mcg at 12/23/17 [phone]  . MEDLINE mouth rinse  15 mL Mouth Rinse BID Irene Pap N, DO   15 mL at 12/22/17 2118  . ondansetron (ZOFRAN) tablet 4 mg  4 mg Oral Q6H PRN Karmen Bongo, MD       Or  . ondansetron [facility]) injection 4 mg  4 mg Intravenous Q6H PRN Karmen Bongo, MD   4 mg at 12/21/17 1452  . pravastatin (PRAVACHOL) tablet 40 mg  40 mg Oral q1800 Karmen Bongo, MD   40 mg at 12/22/17 1813  . terazosin (HYTRIN) capsule 2 mg  2 mg Oral QHS Hall, Carole N, DO         Objective: Vital signs in last 24 hours: Temp:  [97.6 F (36.4 C)-100 F (37.8 C)] 97.6 F (36.4 C) (08/10 0800) Pulse Rate:  [82-100] 95 (08/10 0600) Resp:  [12-23] 15 (08/10 0600) BP: (  160-217)/(27-74) 217/44 (08/10 0600) SpO2:  [89 %-100 %] 90 % (08/10 0600)  Intake/Output from previous day: 08/09 0701 - 08/10 0700 In: 696 [I.V.:600; IV Piggyback:96] Out: 675 [Urine:675] Intake/Output this shift: No intake/output data recorded.   Physical Exam  Constitutional: She appears well-developed and well-nourished.  Abdominal: There is tenderness in the right upper quadrant.  Vitals reviewed.   Lab Results:  Recent Labs    12/22/17 0359 12/23/17 0346  WBC 16.9* 15.6*  HGB 10.0* 10.5*  HCT 30.3* 32.5*  PLT 113* 129*   BMET Recent Labs    12/22/17 0359 12/23/17 0346  NA 135 135  K 4.6 4.3  CL 106 104  CO2 24 23  GLUCOSE 112* 110*  BUN 24* 33*  CREATININE 1.28* 1.18*  CALCIUM 8.4* 8.3*   PT/INR No results for input(s): LABPROT, INR in the last 72 hours. ABG Recent Labs    12/21/17 1518  PHART 7.348*  HCO3 20.6    Studies/Results: Dg Chest Port 1 View  Result Date: 12/21/2017 CLINICAL DATA:  Shortness of  breath. EXAM: PORTABLE CHEST 1 VIEW COMPARISON:  Chest x-ray dated November 03, 2017. FINDINGS: Stable cardiomegaly. Normal pulmonary vascularity. Atherosclerotic calcification of the aortic arch. Mild bibasilar atelectasis. No focal consolidation, pleural effusion, or pneumothorax. No acute osseous abnormality. IMPRESSION: Mild bibasilar atelectasis.  No active disease. Electronically Signed   By: Titus Dubin M.D.   On: 12/21/2017 16:17   Dg C-arm 1-60 Min-no Report  Result Date: 12/22/2017 Fluoroscopy was utilized by the requesting physician.  No radiographic interpretation.     Assessment and Plan: Right ureteral stricture with pyonephrosis s/p stent placement on 8/9.  Afebrile with dec WBC count.  Cx. Pending.  Continue current management.       LOS: 2 days    Irine Seal 12/23/2017 [phone] ID: Brooke Walker, female   DOB: 1930-12-25, 84 y.o.   MRN: [id number]

## 2017-12-23 NOTE — Progress Notes (Addendum)
PROGRESS NOTE  Brooke Walker QVZ:[id number] DOB: Oct 26, 1930 DOA: 12/20/2017 PCP: [name] I, MD  HPI/Recap of past 24 hours: Brooke Walker is a 84 y.o. female with medical history significant of TIA; mesenteric ischemia; HTN; HLD; CAD; and R adrenal cancer presenting with right flank pain.  She has been having extreme pain along the right flank 1 day duration.  Associated nausea and vomiting. UA is c/w UTI. WBC 20k. CT with severe R hydronephrosis and evidence for pyelonephritis.    12/21/2017: Patient seen and examined with daughter and daughter-in-law at bedside.  She reports persistent nausea requiring IV antiemetic and severe right flank pain requiring IV pain medications.  Consulted urology Dr. Karsten Ro will see in consultation.  12/22/2017: Seen and examined at bedside.  Pain is well controlled on current pain management.  Endoscopy with right retrograde pyelogram/right double-J stent placement plan today.  12/23/2017: Patient seen and examined at her bedside.  She has no new complaints she reports her left flank pain is much improved.  Denies nausea.  Assessment/Plan: Principal Problem:   Pyelonephritis Active Problems:   Hyperglycemia   Essential hypertension   Left renal mass   Hydronephrosis of right kidney   Arrhythmia   Pancreatic cyst  Right pyonephrosis with ureteral stricture status post right ureteral stent placement POD #1 Urine culture greater than 100,000 colonies of E. Coli- Awaiting urine culture sensitivities  Cultures x2 negative to date Continue ceftriaxone empirically Afebrile Leukocytosis is improving  Accelerated hypertension Poorly controlled On highest dose of amlodipine and hydralazine Add terazosin 5 mg nightly Continue to hold losartan due to increasing creatinine IV hydralazine as needed 10 mg every 6 hours for systolic blood pressure greater than 329 or diastolic less than 518  Isolated diastolic hypotension dBP in the 30's Will consult  cardio on 12/24/17  Left renal mass Per patient it is not new was diagnosed at [facility] previously Follow with oncology outpatient  Pancreatic cysts No significant changes compared to prior CT per radiology  Diffuse bowel wall thickening of left side transverse colon Incidentally found on CT abdomen and pelvis with contrast done on 12/20/2017 Does not appear to have left sided abdominal pain on palpation during physical exam Continue to monitor  History of right adrenal carcinoma Will need to follow-up with her oncologist outpatient  Hyperkalemia, resolved Treated Potassium 6.1>> 4.6 Repeat BMP in the morning  CKD 3 Creatinine at baseline 1.04 with GFR 48 Creatinine improving 1.18 from 1.1-8  Avoid nephrotoxic agents  Hypomagnesemia Magnesium 1.9 Repleted with 1 dose of IV magnesium 2 g  Small pericardial effusion Confirmed on 2D echo No wall motion abnormalities and normal LVEF   Code Status: DNR  Family Communication: Daughter and daughter-in-law at bedside 12/21/2017.  All questions answered to their satisfaction.  Disposition Plan: Home possibly tomorrow 12/23/2017 or when urology signs off.  Consultants:  Urology  Procedures:  Cystoscopy with retrograde and possible right ureteral-stent placement  Antimicrobials:  Ceftriaxone  DVT prophylaxis: Subcu Lovenox daily   Objective: Vitals:   12/23/17 0600 12/23/17 0700 12/23/17 0800 12/23/17 1200  BP: (!) 217/44 (!) 189/52    Pulse: 95 98    Resp: 15 17    Temp:   97.6 F (36.4 C) 97.7 F (36.5 C)  TempSrc:   Oral Oral  SpO2: 90% 91%    Weight:      Height:        Intake/Output Summary (Last 24 hours) at 12/23/2017 1401 Last data filed at 12/23/2017 [id number]  Gross per 24 hour  Intake 395.99 ml  Output 675 ml  Net -279.01 ml   Filed Weights   12/20/17 0850  Weight: 54.4 kg    Exam:  . General: 84 y.o. year-old female well-developed well-nourished in no acute distress.  Alert and oriented x3.    . Cardiovascular: Regular rate and rhythm with no rubs or gallops.  No thyromegaly or JVD noted.   Marland Kitchen Respiratory: Clear to auscultation with no wheezes or rales. Good inspiratory effort. . Abdomen: No tenderness on palpation with normal bowel sounds x4 quadrant.   . Musculoskeletal: No lower extremity edema. 2/4 pulses in all 4 extremities. . Skin: No ulcerative lesions noted or rashes . Psychiatry: Mood is appropriate for condition and setting   Data Reviewed: CBC: Recent Labs  Lab 12/20/17 0913 12/21/17 0424 12/22/17 0359 12/23/17 0346  WBC 21.9* 22.0* 16.9* 15.6*  NEUTROABS 20.0*  --   --   --   HGB 11.7* 10.6* 10.0* 10.5*  HCT 34.9* 33.7* 30.3* 32.5*  MCV 95.6 98.8 96.2 96.4  PLT 166 145* 113* 224*   Basic Metabolic Panel: Recent Labs  Lab 12/20/17 0913 12/21/17 0424 12/21/17 1344 12/22/17 0359 12/23/17 0346  NA 137 144 139 135 135  K 4.3 6.1* 4.9 4.6 4.3  CL 107 112* 108 106 104  CO2 19* 25 24 24 23   GLUCOSE 164* 118* 131* 112* 110*  BUN 30* 23 23 24* 33*  CREATININE 1.04* 1.26* 1.20* 1.28* 1.18*  CALCIUM 9.3 8.9 8.8* 8.4* 8.3*  MG  --   --   --   --  1.9   GFR: Estimated Creatinine Clearance: 30.5 mL/min (A) (by C-G formula based on SCr of 1.18 mg/dL (H)). Liver Function Tests: Recent Labs  Lab 12/20/17 0913 12/22/17 0359  AST 39 19  ALT 23 14  ALKPHOS 67 60  BILITOT 0.7 0.7  PROT 7.5 5.8*  ALBUMIN 4.3 2.8*   Recent Labs  Lab 12/20/17 0913  LIPASE 22   No results for input(s): AMMONIA in the last 168 hours. Coagulation Profile: No results for input(s): INR, PROTIME in the last 168 hours. Cardiac Enzymes: No results for input(s): CKTOTAL, CKMB, CKMBINDEX, TROPONINI in the last 168 hours. BNP (last 3 results) No results for input(s): PROBNP in the last 8760 hours. HbA1C: No results for input(s): HGBA1C in the last 72 hours. CBG: No results for input(s): GLUCAP in the last 168 hours. Lipid Profile: No results for input(s): CHOL, HDL,  LDLCALC, TRIG, CHOLHDL, LDLDIRECT in the last 72 hours. Thyroid Function Tests: Recent Labs    12/20/17 1819  TSH 6.861*  FREET4 0.81*   Anemia Panel: No results for input(s): VITAMINB12, FOLATE, FERRITIN, TIBC, IRON, RETICCTPCT in the last 72 hours. Urine analysis:    Component Value Date/Time   COLORURINE YELLOW 12/20/2017 0901   APPEARANCEUR HAZY (A) 12/20/2017 0901   LABSPEC 1.010 12/20/2017 0901   PHURINE 6.0 12/20/2017 0901   GLUCOSEU NEGATIVE 12/20/2017 0901   HGBUR NEGATIVE 12/20/2017 0901   BILIRUBINUR NEGATIVE 12/20/2017 0901   KETONESUR NEGATIVE 12/20/2017 0901   PROTEINUR 100 (A) 12/20/2017 0901   NITRITE POSITIVE (A) 12/20/2017 0901   LEUKOCYTESUR NEGATIVE 12/20/2017 0901   Sepsis Labs: @LABRCNTIP (procalcitonin:4,lacticidven:4)  ) Recent Results (from the past 240 hour(s))  Urine culture     Status: Abnormal   Collection Time: 12/20/17  9:01 AM  Result Value Ref Range Status   Specimen Description   Final    URINE, CLEAN CATCH Performed at  [facility], [address] [place], [place] [address]    Special Requests   Final    NONE Performed at [facility], [address] [place], [place] [address]    Culture >=100,000 COLONIES/mL ESCHERICHIA COLI (A)  Final   Report Status 12/23/2017 FINAL  Final   Organism ID, Bacteria ESCHERICHIA COLI (A)  Final      Susceptibility   Escherichia coli - MIC*    AMPICILLIN 4 SENSITIVE Sensitive     CEFAZOLIN <=4 SENSITIVE Sensitive     CEFTRIAXONE <=1 SENSITIVE Sensitive     CIPROFLOXACIN <=0.25 SENSITIVE Sensitive     GENTAMICIN <=1 SENSITIVE Sensitive     IMIPENEM <=0.25 SENSITIVE Sensitive     NITROFURANTOIN <=16 SENSITIVE Sensitive     TRIMETH/SULFA <=20 SENSITIVE Sensitive     AMPICILLIN/SULBACTAM <=2 SENSITIVE Sensitive     PIP/TAZO <=4 SENSITIVE Sensitive     Extended ESBL NEGATIVE Sensitive     * >=100,000 COLONIES/mL ESCHERICHIA COLI  Culture, blood (routine x 2)     Status: None  (Preliminary result)   Collection Time: 12/21/17  1:44 PM  Result Value Ref Range Status   Specimen Description   Final    BLOOD LEFT ANTECUBITAL Performed at [facility] [address] [place], [place] [address]    Special Requests   Final    BOTTLES DRAWN AEROBIC AND ANAEROBIC Blood Culture adequate volume Performed at [facility] [address] [place], [place] [address]    Culture   Final    NO GROWTH 2 DAYS Performed at [facility] [address], [place], [place] [address]    Report Status PENDING  Incomplete  Culture, blood (routine x 2)     Status: None (Preliminary result)   Collection Time: 12/21/17  1:54 PM  Result Value Ref Range Status   Specimen Description   Final    BLOOD LEFT HAND Performed at [facility] [address] [place], [place] [address]    Special Requests   Final    BOTTLES DRAWN AEROBIC ONLY Blood Culture adequate volume Performed at [facility] [address], [place], [place] [address]    Culture   Final    NO GROWTH 2 DAYS Performed at [facility] [address], [place], [place] [address]    Report Status PENDING  Incomplete  MRSA PCR Screening     Status: None   Collection Time: 12/21/17  4:42 PM  Result Value Ref Range Status   MRSA by PCR NEGATIVE NEGATIVE Final    Comment:        The GeneXpert MRSA Assay (FDA approved for NASAL specimens only), is one component of a comprehensive MRSA colonization surveillance program. It is not intended to diagnose MRSA infection nor to guide or monitor treatment for MRSA infections. Performed at [facility], [place] [address], [place], [place] [address]   Urine Culture     Status: Abnormal (Preliminary result)   Collection Time: 12/22/17 12:55 PM  Result Value Ref Range Status   Specimen Description   Final    URINE, RANDOM CYSTOSCOPY Performed at [facility] [address] [place], [place] [address]    Special Requests   Final    NONE Performed at [facility], [place] [address], [place], [place] [address]    Culture (A)  Final    60,000  COLONIES/mL ESCHERICHIA COLI SUSCEPTIBILITIES TO FOLLOW Performed at [facility] [address], [place], [place] [address]    Report Status PENDING  Incomplete      Studies: No results found.  Scheduled Meds: . amLODipine  10 mg Oral Daily  . aspirin EC  81 mg Oral Daily  . clopidogrel  75 mg Oral Q breakfast  . docusate sodium  100 mg Oral BID  . enoxaparin (LOVENOX) injection  30 mg Subcutaneous Q24H  . hydrALAZINE  100 mg Oral Q8H  . levothyroxine  112 mcg Oral QAC breakfast  . mouth rinse  15 mL Mouth Rinse BID  . pravastatin  40 mg Oral q1800  . terazosin  2 mg Oral QHS    Continuous Infusions: . cefTRIAXone (ROCEPHIN)  IV Stopped (12/22/17 2145)     LOS: 2 days     Kayleen Memos, MD [facility] Pager [phone]  If 7PM-7AM, please contact night-coverage [email or web address] Password [email or web address] 12/23/2017, 2:01 PM

## 2017-12-23 NOTE — Evaluation (Signed)
Physical Therapy Evaluation Patient Details Name: Brooke Walker MRN: [id number] DOB: Feb 14, 1931 Today's Date: 12/23/2017   History of Present Illness  Pt admitted with pyelonephritis and s/p R Double - J stent placement on 01/22/18.  Pt with hx of TIA, SVT, CAD adrenal CA and peripheral polyneuropathy  Clinical Impression  Pt admitted as above and presenting with functional mobility limitations 2* generalized weakness and ambulatory balance deficits.  Pt is determined to regain IND and return home to IND living at [facility].    Follow Up Recommendations Home health PT(dependent on acute stay progress)    Equipment Recommendations  None recommended by PT    Recommendations for Other Services       Precautions / Restrictions Precautions Precautions: Fall Precaution Comments: Pt unsteady on feet but refusing to use RW Restrictions Weight Bearing Restrictions: No      Mobility  Bed Mobility Overal bed mobility: Modified Independent             General bed mobility comments: Increased time but pt moves supine to sit unassisted  Transfers Overall transfer level: Needs assistance   Transfers: Sit to/from Stand Sit to Stand: Min assist         General transfer comment: assist to steady on standing  Ambulation/Gait Ambulation/Gait assistance: Min assist Gait Distance (Feet): 200 Feet Assistive device: 1 person hand held assist Gait Pattern/deviations: Step-through pattern;Decreased step length - right;Decreased step length - left;Shuffle;Staggering left;Staggering right Gait velocity: decr   General Gait Details: Pt unsteady and requiring assist at prevent falls.  Pt unwilling to use RW "I'm just unsteady bc I've been in bed since Wednesday".  Multiple short standing rests required to complete task  Stairs            Wheelchair Mobility    Modified Rankin (Stroke Patients Only)       Balance Overall balance assessment: Needs assistance Sitting-balance  support: No upper extremity supported;Feet supported Sitting balance-Leahy Scale: Good     Standing balance support: Single extremity supported Standing balance-Leahy Scale: Poor                               Pertinent Vitals/Pain Pain Assessment: No/denies pain    Home Living Family/patient expects to be discharged to:: Other (Comment)(IND living at [facility])                      Prior Function Level of Independence: Independent               Hand Dominance        Extremity/Trunk Assessment   Upper Extremity Assessment Upper Extremity Assessment: Generalized weakness    Lower Extremity Assessment Lower Extremity Assessment: Generalized weakness    Cervical / Trunk Assessment Cervical / Trunk Assessment: Kyphotic  Communication   Communication: No difficulties  Cognition Arousal/Alertness: Awake/alert Behavior During Therapy: WFL for tasks assessed/performed Overall Cognitive Status: Within Functional Limits for tasks assessed                                        General Comments      Exercises     Assessment/Plan    PT Assessment Patient needs continued PT services  PT Problem List Decreased strength;Decreased activity tolerance;Decreased balance;Decreased mobility;Decreased knowledge of use of DME       PT  Treatment Interventions DME instruction;Gait training;Functional mobility training;Therapeutic activities;Therapeutic exercise;Balance training;Patient/family education    PT Goals (Current goals can be found in the Care Plan section)  Acute Rehab PT Goals Patient Stated Goal: Regain IND and go home PT Goal Formulation: With patient Time For Goal Achievement: 01/06/18 Potential to Achieve Goals: Good    Frequency Min 3X/week   Barriers to discharge        Co-evaluation               AM-PAC PT "6 Clicks" Daily Activity  Outcome Measure Difficulty turning over in bed (including adjusting  bedclothes, sheets and blankets)?: A Lot Difficulty moving from lying on back to sitting on the side of the bed? : A Lot Difficulty sitting down on and standing up from a chair with arms (e.g., wheelchair, bedside commode, etc,.)?: A Lot Help needed moving to and from a bed to chair (including a wheelchair)?: A Lot Help needed walking in hospital room?: A Little Help needed climbing 3-5 steps with a railing? : A Lot 6 Click Score: 13    End of Session Equipment Utilized During Treatment: Oxygen Activity Tolerance: Patient tolerated treatment well Patient left: in chair;with call bell/phone within reach Nurse Communication: Mobility status PT Visit Diagnosis: Unsteadiness on feet (R26.81);Muscle weakness (generalized) (M62.81);Difficulty in walking, not elsewhere classified (R26.2)    Time: 4650-3546 PT Time Calculation (min) (ACUTE ONLY): 30 min   Charges:   PT Evaluation $PT Eval Low Complexity: 1 Low PT Treatments $Gait Training: 8-22 mins        Pg [phone]   Ranetta Armacost 12/23/2017, 1:29 PM

## 2017-12-24 LAB — URINE CULTURE: Culture: 60000 — AB

## 2017-12-24 LAB — CBC
HEMATOCRIT: 28.7 % — AB (ref 36.0–46.0)
Hemoglobin: 9.3 g/dL — ABNORMAL LOW (ref 12.0–15.0)
MCH: 31.2 pg (ref 26.0–34.0)
MCHC: 32.4 g/dL (ref 30.0–36.0)
MCV: 96.3 fL (ref 78.0–100.0)
Platelets: 112 10*3/uL — ABNORMAL LOW (ref 150–400)
RBC: 2.98 MIL/uL — ABNORMAL LOW (ref 3.87–5.11)
RDW: 14.1 % (ref 11.5–15.5)
WBC: 10.3 10*3/uL (ref 4.0–10.5)

## 2017-12-24 MED ORDER — FUROSEMIDE 10 MG/ML IJ SOLN
40.0000 mg | Freq: Once | INTRAMUSCULAR | Status: AC
  Administered 2017-12-24: 40 mg via INTRAVENOUS
  Filled 2017-12-24: qty 4

## 2017-12-24 NOTE — Anesthesia Postprocedure Evaluation (Signed)
Anesthesia Post Note  Patient: Brooke Walker  Procedure(s) Performed: CYSTOSCOPY WITH RETROGRADE PYELOGRAM, AND STENT PLACEMENT (Right )     Patient location during evaluation: PACU Anesthesia Type: General Level of consciousness: sedated Pain management: pain level controlled Vital Signs Assessment: post-procedure vital signs reviewed and stable Respiratory status: spontaneous breathing and respiratory function stable Cardiovascular status: stable Postop Assessment: no apparent nausea or vomiting Anesthetic complications: no                  Tereza Gilham DANIEL

## 2017-12-24 NOTE — Progress Notes (Addendum)
PROGRESS NOTE  Brooke Walker GOT:[id number] DOB: 1930-08-06 DOA: 12/20/2017 PCP: [name] I, MD  HPI/Recap of past 24 hours: Brooke Walker is a 84 y.o. female with medical history significant of TIA; mesenteric ischemia; HTN; HLD; CAD; and R adrenal cancer presenting with right flank pain.  She has been having extreme pain along the right flank 1 day duration.  Associated nausea and vomiting. UA is c/w UTI. WBC 20k. CT with severe R hydronephrosis and evidence for pyelonephritis.    12/21/2017: Patient seen and examined with daughter and daughter-in-law at bedside.  She reports persistent nausea requiring IV antiemetic and severe right flank pain requiring IV pain medications.  Consulted urology Dr. Karsten Ro will see in consultation.  12/22/2017: Seen and examined at bedside.  Pain is well controlled on current pain management.  Endoscopy with right retrograde pyelogram/right double-J stent placement plan today.  12/23/2017: Patient seen and examined at her bedside.  She has no new complaints she reports her left flank pain is much improved.  Denies nausea.  12/24/2017: Patient seen and examined at the bedside.  She states her pain has nearly resolved.  Patient's blood pressure continues to be uncontrolled even with additional third blood pressure medications.  40 mg IV Lasix given once.  Isolated diastolic hypertension also noted.  Cardiology consulted to manage cardiac meds.  Assessment/Plan: Principal Problem:   Pyelonephritis Active Problems:   Hyperglycemia   Essential hypertension   Left renal mass   Hydronephrosis of right kidney   Arrhythmia   Pancreatic cyst  Right pyonephrosis with ureteral stricture status post right ureteral stent placement POD #2 Urine culture greater than 100,000 colonies of E. Coli-pansensitive Blood cultures x2 negative to date Continue ceftriaxone empirically Afebrile, leukocytosis has resolved  Isolated diastolic hypertension Diastolic blood  pressure between 26 and 57 Cardiology consulted to manage cardiac meds  Hypertensive emergency in the setting of chronically poorly controlled hypertension Independently reviewed chest x-ray done on 12/21/2017 which revealed mild increase in pulmonary vascularity with suspicion for mild pulmonary edema Give 1 dose IV Lasix 40 mg once. On 3 blood pressure medications Amlodipine 10 mg daily, hydralazine 100 mg 3 times daily, and terazosin 5 mg nightly On highest dose of amlodipine and hydralazine IV hydralazine 10 mg every 4 hours as needed for blood pressure greater than 597 or diastolic blood pressure greater than 105 Continue to closely monitor vital signs  Acute hypoxic respiratory failure suspect secondary to acute mild pulmonary edema IV Lasix 40 mg once Monitor urine output O2 supplementation as needed to maintain O2 saturation greater than 90% Not on O2 supplement at home Wean off oxygen as tolerated  Chronic normocytic anemia Based on hemoglobin 11 Hemoglobin 9.3 No sign of overt bleeding Obtain FOBT Obtain iron studies Repeat CBC in the morning  Thrombocytopenia Platelet 112k No sign of overt bleeding Continue to monitor  Left renal mass Per patient it is not new was diagnosed at [facility] previously Follow with oncology outpatient  Pancreatic cysts No significant changes compared to prior CT per radiology  Diffuse bowel wall thickening of left side transverse colon Incidentally found on CT abdomen and pelvis with contrast done on 12/20/2017 Does not appear to have left sided abdominal pain on palpation during physical exam Continue to monitor  History of right adrenal carcinoma Will need to follow-up with her oncologist outpatient  Hyperkalemia, resolved Treated Potassium 6.1>> 4.6 Repeat BMP in the morning  CKD 3 Creatinine at baseline 1.04 with GFR 48 Creatinine improving 1.18  from 1.1-8  Avoid nephrotoxic agents  Hypomagnesemia Magnesium  1.9 Repleted with 1 dose of IV magnesium 2 g  Small pericardial effusion Confirmed on 2D echo No wall motion abnormalities and normal LVEF   Code Status: DNR  Family Communication: Daughter and daughter-in-law at bedside 12/21/2017.  All questions answered to their satisfaction.  Disposition Plan: Home possibly tomorrow 12/23/2017 or when urology signs off.  Consultants:  Urology  Procedures:  Cystoscopy with retrograde and right ureteral-stent placement  Antimicrobials:  Ceftriaxone  DVT prophylaxis: Subcu Lovenox daily   Objective: Vitals:   12/24/17 0400 12/24/17 0624 12/24/17 0800 12/24/17 0853  BP: (!) 193/29 (!) 198/42 (!) 211/44 (!) 203/48  Pulse: 98 (!) 103 (!) 103   Resp: 14 (!) 27 13   Temp:   98.3 F (36.8 C)   TempSrc:   Oral   SpO2: 92% 91% 90%   Weight:      Height:        Intake/Output Summary (Last 24 hours) at 12/24/2017 1005 Last data filed at 12/24/2017 4010 Gross per 24 hour  Intake 111.91 ml  Output 255 ml  Net -143.09 ml   Filed Weights   12/20/17 0850  Weight: 54.4 kg    Exam:  . General: 84 y.o. year-old female well-developed well-nourished in no acute distress.  Alert and oriented x3.   . Cardiovascular: Regular rate and rhythm with no rubs or gallops.  No thyromegaly or JVD noted.   Marland Kitchen Respiratory: Clear to auscultation with no wheezes or rales. Good inspiratory effort. . Abdomen: No tenderness on palpation with normal bowel sounds x4 quadrant.   . Musculoskeletal: No lower extremity edema. 2/4 pulses in all 4 extremities. . Skin: No ulcerative lesions noted or rashes . Psychiatry: Mood is appropriate for condition and setting   Data Reviewed: CBC: Recent Labs  Lab 12/20/17 0913 12/21/17 0424 12/22/17 0359 12/23/17 0346 12/24/17 0346  WBC 21.9* 22.0* 16.9* 15.6* 10.3  NEUTROABS 20.0*  --   --   --   --   HGB 11.7* 10.6* 10.0* 10.5* 9.3*  HCT 34.9* 33.7* 30.3* 32.5* 28.7*  MCV 95.6 98.8 96.2 96.4 96.3  PLT 166 145*  113* 129* 272*   Basic Metabolic Panel: Recent Labs  Lab 12/20/17 0913 12/21/17 0424 12/21/17 1344 12/22/17 0359 12/23/17 0346  NA 137 144 139 135 135  K 4.3 6.1* 4.9 4.6 4.3  CL 107 112* 108 106 104  CO2 19* 25 24 24 23   GLUCOSE 164* 118* 131* 112* 110*  BUN 30* 23 23 24* 33*  CREATININE 1.04* 1.26* 1.20* 1.28* 1.18*  CALCIUM 9.3 8.9 8.8* 8.4* 8.3*  MG  --   --   --   --  1.9   GFR: Estimated Creatinine Clearance: 30.5 mL/min (A) (by C-G formula based on SCr of 1.18 mg/dL (H)). Liver Function Tests: Recent Labs  Lab 12/20/17 0913 12/22/17 0359  AST 39 19  ALT 23 14  ALKPHOS 67 60  BILITOT 0.7 0.7  PROT 7.5 5.8*  ALBUMIN 4.3 2.8*   Recent Labs  Lab 12/20/17 0913  LIPASE 22   No results for input(s): AMMONIA in the last 168 hours. Coagulation Profile: No results for input(s): INR, PROTIME in the last 168 hours. Cardiac Enzymes: No results for input(s): CKTOTAL, CKMB, CKMBINDEX, TROPONINI in the last 168 hours. BNP (last 3 results) No results for input(s): PROBNP in the last 8760 hours. HbA1C: No results for input(s): HGBA1C in the last 72 hours. CBG: No  results for input(s): GLUCAP in the last 168 hours. Lipid Profile: No results for input(s): CHOL, HDL, LDLCALC, TRIG, CHOLHDL, LDLDIRECT in the last 72 hours. Thyroid Function Tests: No results for input(s): TSH, T4TOTAL, FREET4, T3FREE, THYROIDAB in the last 72 hours. Anemia Panel: No results for input(s): VITAMINB12, FOLATE, FERRITIN, TIBC, IRON, RETICCTPCT in the last 72 hours. Urine analysis:    Component Value Date/Time   COLORURINE YELLOW 12/20/2017 0901   APPEARANCEUR HAZY (A) 12/20/2017 0901   LABSPEC 1.010 12/20/2017 0901   PHURINE 6.0 12/20/2017 0901   GLUCOSEU NEGATIVE 12/20/2017 0901   HGBUR NEGATIVE 12/20/2017 0901   BILIRUBINUR NEGATIVE 12/20/2017 0901   KETONESUR NEGATIVE 12/20/2017 0901   PROTEINUR 100 (A) 12/20/2017 0901   NITRITE POSITIVE (A) 12/20/2017 0901   LEUKOCYTESUR NEGATIVE  12/20/2017 0901   Sepsis Labs: @LABRCNTIP (procalcitonin:4,lacticidven:4)  ) Recent Results (from the past 240 hour(s))  Urine culture     Status: Abnormal   Collection Time: 12/20/17  9:01 AM  Result Value Ref Range Status   Specimen Description   Final    URINE, CLEAN CATCH Performed at [facility], [address] [place], [place] [address]    Special Requests   Final    NONE Performed at [facility], [address] [place], [place] [address]    Culture >=100,000 COLONIES/mL ESCHERICHIA COLI (A)  Final   Report Status 12/23/2017 FINAL  Final   Organism ID, Bacteria ESCHERICHIA COLI (A)  Final      Susceptibility   Escherichia coli - MIC*    AMPICILLIN 4 SENSITIVE Sensitive     CEFAZOLIN <=4 SENSITIVE Sensitive     CEFTRIAXONE <=1 SENSITIVE Sensitive     CIPROFLOXACIN <=0.25 SENSITIVE Sensitive     GENTAMICIN <=1 SENSITIVE Sensitive     IMIPENEM <=0.25 SENSITIVE Sensitive     NITROFURANTOIN <=16 SENSITIVE Sensitive     TRIMETH/SULFA <=20 SENSITIVE Sensitive     AMPICILLIN/SULBACTAM <=2 SENSITIVE Sensitive     PIP/TAZO <=4 SENSITIVE Sensitive     Extended ESBL NEGATIVE Sensitive     * >=100,000 COLONIES/mL ESCHERICHIA COLI  Culture, blood (routine x 2)     Status: None (Preliminary result)   Collection Time: 12/21/17  1:44 PM  Result Value Ref Range Status   Specimen Description   Final    BLOOD LEFT ANTECUBITAL Performed at [facility] [address], [place], [place] [address]    Special Requests   Final    BOTTLES DRAWN AEROBIC AND ANAEROBIC Blood Culture adequate volume Performed at [facility] [address] [place], [place] [address]    Culture   Final    NO GROWTH 2 DAYS Performed at [facility] [address], [place], [place] [address]    Report Status PENDING  Incomplete  Culture, blood (routine x 2)     Status: None (Preliminary result)   Collection Time: 12/21/17  1:54 PM    Result Value Ref Range Status   Specimen Description   Final    BLOOD LEFT HAND Performed at [facility] [address], [place], [place] [address]    Special Requests   Final    BOTTLES DRAWN AEROBIC ONLY Blood Culture adequate volume Performed at [facility] [address] [place], [place] [address]    Culture   Final    NO GROWTH 2 DAYS Performed at [place],  [place] [address]    Report Status PENDING  Incomplete  MRSA PCR Screening     Status: None   Collection Time: 12/21/17  4:42 PM  Result Value Ref Range Status   MRSA by PCR NEGATIVE NEGATIVE Final    Comment:        The GeneXpert MRSA Assay (FDA approved for NASAL specimens only), is one component of a comprehensive MRSA colonization surveillance program. It is not intended to diagnose MRSA infection nor to guide or monitor treatment for MRSA infections. Performed at [facility], [place] [address], [place], [place] [address]   Urine Culture     Status: Abnormal   Collection Time: 12/22/17 12:55 PM  Result Value Ref Range Status   Specimen Description   Final    URINE, RANDOM CYSTOSCOPY Performed at [facility] [address], [place], [place] [address]    Special Requests   Final    NONE Performed at [facility], [place] [address] [place], [place] [address]    Culture 60,000 COLONIES/mL ESCHERICHIA COLI (A)  Final   Report Status 12/24/2017 FINAL  Final   Organism ID, Bacteria ESCHERICHIA COLI (A)  Final      Susceptibility   Escherichia coli - MIC*    AMPICILLIN <=2 SENSITIVE Sensitive     CEFAZOLIN <=4 SENSITIVE Sensitive     CEFTRIAXONE <=1 SENSITIVE Sensitive     CIPROFLOXACIN <=0.25 SENSITIVE Sensitive     GENTAMICIN <=1 SENSITIVE Sensitive     IMIPENEM <=0.25 SENSITIVE Sensitive     NITROFURANTOIN <=16 SENSITIVE Sensitive     TRIMETH/SULFA <=20 SENSITIVE Sensitive      AMPICILLIN/SULBACTAM <=2 SENSITIVE Sensitive     PIP/TAZO <=4 SENSITIVE Sensitive     Extended ESBL NEGATIVE Sensitive     * 60,000 COLONIES/mL ESCHERICHIA COLI      Studies: No results found.  Scheduled Meds: . amLODipine  10 mg Oral Daily  . aspirin EC  81 mg Oral Daily  . clopidogrel  75 mg Oral Q breakfast  . docusate sodium  100 mg Oral BID  . enoxaparin (LOVENOX) injection  30 mg Subcutaneous Q24H  . hydrALAZINE  100 mg Oral Q8H  . levothyroxine  112 mcg Oral QAC breakfast  . mouth rinse  15 mL Mouth Rinse BID  . pravastatin  40 mg Oral q1800  . terazosin  5 mg Oral QHS    Continuous Infusions: . cefTRIAXone (ROCEPHIN)  IV Stopped (12/23/17 2147)     LOS: 3 days     Kayleen Memos, MD [facility] Pager [phone]  If 7PM-7AM, please contact night-coverage [email or web address] Password [email or web address] 12/24/2017, 10:05 AM

## 2017-12-25 ENCOUNTER — Encounter (HOSPITAL_COMMUNITY): Payer: Self-pay | Admitting: Urology

## 2017-12-25 LAB — RETICULOCYTES
RBC.: 2.9 MIL/uL — ABNORMAL LOW (ref 3.87–5.11)
Retic Count, Absolute: 40.6 10*3/uL (ref 19.0–186.0)
Retic Ct Pct: 1.4 % (ref 0.4–3.1)

## 2017-12-25 LAB — COMPREHENSIVE METABOLIC PANEL
ALT: 11 U/L (ref 0–44)
AST: 14 U/L — ABNORMAL LOW (ref 15–41)
Albumin: 2.5 g/dL — ABNORMAL LOW (ref 3.5–5.0)
Alkaline Phosphatase: 48 U/L (ref 38–126)
Anion gap: 7 (ref 5–15)
BILIRUBIN TOTAL: 0.5 mg/dL (ref 0.3–1.2)
BUN: 37 mg/dL — ABNORMAL HIGH (ref 8–23)
CO2: 25 mmol/L (ref 22–32)
CREATININE: 1.1 mg/dL — AB (ref 0.44–1.00)
Calcium: 8.2 mg/dL — ABNORMAL LOW (ref 8.9–10.3)
Chloride: 103 mmol/L (ref 98–111)
GFR calc Af Amer: 52 mL/min — ABNORMAL LOW (ref >60)
GFR calc non Af Amer: 45 mL/min — ABNORMAL LOW (ref >60)
Glucose, Bld: 120 mg/dL — ABNORMAL HIGH (ref 70–99)
Potassium: 4.4 mmol/L (ref 3.5–5.1)
Sodium: 135 mmol/L (ref 135–145)
TOTAL PROTEIN: 5.6 g/dL — AB (ref 6.5–8.1)

## 2017-12-25 LAB — IRON AND TIBC
Iron: 13 ug/dL — ABNORMAL LOW (ref 28–170)
SATURATION RATIOS: 7 % — AB (ref 10.4–31.8)
TIBC: 192 ug/dL — AB (ref 250–450)
UIBC: 179 ug/dL

## 2017-12-25 LAB — FERRITIN: FERRITIN: 58 ng/mL (ref 11–307)

## 2017-12-25 LAB — SAVE SMEAR

## 2017-12-25 LAB — LACTATE DEHYDROGENASE: LDH: 109 U/L (ref 98–192)

## 2017-12-25 LAB — VITAMIN B12: VITAMIN B 12: 333 pg/mL (ref 180–914)

## 2017-12-25 MED ORDER — POLYETHYLENE GLYCOL 3350 17 G PO PACK: 17.0000 g | PACK | Freq: Every day | ORAL | 0 refills | Status: AC

## 2017-12-25 MED ORDER — POLYETHYLENE GLYCOL 3350 17 G PO PACK: 17.0000 g | PACK | Freq: Every day | ORAL | 0 refills | Status: DC

## 2017-12-25 MED ORDER — HYDROCHLOROTHIAZIDE 25 MG PO TABS: 25.0000 mg | ORAL_TABLET | Freq: Every day | ORAL | 0 refills | Status: AC

## 2017-12-25 MED ORDER — TERAZOSIN HCL 5 MG PO CAPS: 5.0000 mg | ORAL_CAPSULE | Freq: Every day | ORAL | 0 refills | Status: DC

## 2017-12-25 MED ORDER — FERROUS SULFATE 325 (65 FE) MG PO TABS: 325.0000 mg | ORAL_TABLET | Freq: Two times a day (BID) | ORAL | 0 refills | Status: DC

## 2017-12-25 MED ORDER — AMLODIPINE BESYLATE 10 MG PO TABS: 10.0000 mg | ORAL_TABLET | Freq: Every day | ORAL | 0 refills | Status: AC

## 2017-12-25 MED ORDER — CIPROFLOXACIN HCL 500 MG PO TABS: 500.0000 mg | ORAL_TABLET | Freq: Two times a day (BID) | ORAL | 0 refills | Status: DC

## 2017-12-25 MED ORDER — HYDROCODONE-ACETAMINOPHEN 5-325 MG PO TABS: 1.0000 | ORAL_TABLET | Freq: Three times a day (TID) | ORAL | 0 refills | Status: DC | PRN

## 2017-12-25 MED ORDER — CIPROFLOXACIN HCL 500 MG PO TABS: 500.0000 mg | ORAL_TABLET | Freq: Two times a day (BID) | ORAL | 0 refills | Status: AC

## 2017-12-25 MED ORDER — HYDRALAZINE HCL 100 MG PO TABS: 100.0000 mg | ORAL_TABLET | Freq: Three times a day (TID) | ORAL | 0 refills | Status: DC

## 2017-12-25 MED ORDER — FERROUS SULFATE 325 (65 FE) MG PO TABS
325.0000 mg | ORAL_TABLET | Freq: Two times a day (BID) | ORAL | Status: DC
  Administered 2017-12-25 (×2): 325 mg via ORAL
  Filled 2017-12-25 (×2): qty 1

## 2017-12-25 MED ORDER — CIPROFLOXACIN HCL 500 MG PO TABS
500.0000 mg | ORAL_TABLET | Freq: Two times a day (BID) | ORAL | Status: DC
  Administered 2017-12-25: 500 mg via ORAL
  Filled 2017-12-25: qty 1

## 2017-12-25 MED ORDER — HYDRALAZINE HCL 100 MG PO TABS: 100.0000 mg | ORAL_TABLET | Freq: Three times a day (TID) | ORAL | 0 refills | Status: AC

## 2017-12-25 MED ORDER — POLYETHYLENE GLYCOL 3350 17 G PO PACK: 17.0000 g | PACK | Freq: Every day | ORAL | Status: DC

## 2017-12-25 NOTE — Care Management Note (Signed)
Case Management Note  Patient Details  Name: Brooke Walker MRN: [id number] Date of Birth: April 01, 1931  Subjective/Objective:    Rt. Hydronephrosis with insertion of rt. Kidney drain/wbc has resolved, pain decreased/uro. Will followup as an oupt one week from dc/                Action/Plan:  Following for cm and dc needs   Expected Discharge Date:  12/22/17               Expected Discharge Plan:  Home/Self Care  In-House Referral:     Discharge planning Services  CM Consult  Post Acute Care Choice:    Choice offered to:     DME Arranged:    DME Agency:     HH Arranged:    HH Agency:     Status of Service:  In process, will continue to follow  If discussed at Long Length of Stay Meetings, dates discussed:    Additional Comments:  Leeroy Cha, RN 12/25/2017, 10:28 AM

## 2017-12-25 NOTE — Discharge Summary (Addendum)
Discharge Summary  Brooke Walker HQI:[id number] DOB: Dec 06, 1930  PCP: [name] I, MD  Admit date: 12/20/2017 Discharge date: 12/25/2017  Time spent: 25 minutes  Recommendations for Outpatient Follow-up:  1. Follow-up with urology outpatient 2. Follow-up with your cardiologist outpatient 3. Follow-up with your PCP outpatient 4. Take your medications as prescribed 5. Continue PT  Discharge Diagnoses:  Active Hospital Problems   Diagnosis Date Noted  . Pyelonephritis 12/20/2017  . Hyperglycemia 12/20/2017  . Essential hypertension 12/20/2017  . Left renal mass 12/20/2017  . Hydronephrosis of right kidney 12/20/2017  . Arrhythmia 12/20/2017  . Pancreatic cyst 12/20/2017    Resolved Hospital Problems  No resolved problems to display.    Discharge Condition: Stable  Diet recommendation: Low-sodium diet  Vitals:   12/25/17 1300 12/25/17 1330  BP: (!) 206/34   Pulse: 97 94  Resp: 13 16  Temp:    SpO2: 95% 94%    History of present illness:  Brooke Walker a 84 y.o.femalewith medical history significant ofTIA; mesenteric ischemia; HTN; HLD; CAD; and R adrenal cancerpresenting with severe right flank pain. UA is c/w UTI. WBC 20k. CT with severe R hydronephrosis and evidence for pyelonephritis.    Urology consulted and on 12/22/2017 was taken for endoscopy with right retrograde pyelogram/right double-J stent placement.  Hospital course complicated by acute hypoxic respiratory failure secondary to pulmonary edema, hypertensive emergency, malignant hypertension with 3 antihypertensive medications and a diuretic intermittently.    Urine culture positive for greater than 100,000 E. coli pan-sensitive.  Blood cultures x2 no growth in 4 days.  12/25/2017: Patient seen and examined at her bedside.  She states she feels better.  States right flank pain is mild 2-3 out of 10 and much improved than when she first came in.  Denies abdominal pain, nausea or vomiting.  Denies chest  pain, palpitations or dyspnea.  On the day of discharge the patient was hemodynamically stable.  She will need to follow-up with urology and her PCP post hospitalization.    Hospital Course:  Principal Problem:   Pyelonephritis Active Problems:   Hyperglycemia   Essential hypertension   Left renal mass   Hydronephrosis of right kidney   Arrhythmia   Pancreatic cyst  Right pyonephrosis with ureteral stricture status post right ureteral stent placement POD #3 Urine culture greater than 100,000 colonies of E. Coli-pansensitive Blood cultures x2 negative to date Afebrile, leukocytosis has resolved Completed 5 days of Rocephin Started on ciprofloxacin 500 mg twice daily x9 days Afebrile, leukocytosis has resolved Avoid NSAIDs for pain control due to CKD 3 Tylenol for mild pain as needed Norco for moderate to severe pain as needed Bowel regimen MiraLAX daily to avoid constipation while taking opiates Limit use of opiates  Hypertensive emergency in the setting of chronically poorly controlled hypertension Independently reviewed chest x-ray done on 12/21/2017 which revealed mild increase in pulmonary vascularity with suspicion for mild pulmonary edema Given 2 doses of IV Lasix 40 mg during this admission Amlodipine 10 mg daily, hydralazine 100 mg 3 times daily, and terazosin 5 mg nightly Follow-up with your primary care provider and cardiologist Can restart HCTZ 25 mg daily and closely follow up with your PCP  Acute hypoxic respiratory failure suspect secondary to acute mild pulmonary edema Received 2 doses of IV Lasix 40 mg during this admission O2 supplement as needed to maintain O2 saturation greater than 90% Home O2 evaluation  Chronic normocytic anemia/iron deficiency anemia Baseline hemoglobin 11 Hemoglobin 9.3 No sign of overt bleeding  Iron deficiency anemia Obtain FOBT Started on ferrous sulfate 325 twice daily Follow-up with your primary care provider  outpatient  Thrombocytopenia Platelet 112k No sign of overt bleeding Follow-up with your PCP outpatient  Left renal mass Per patient it is not new was diagnosed at [facility] previously Follow with oncology outpatient  Pancreatic cysts No significant changes compared to prior CT per radiology  Diffuse bowel wall thickening of left side transverse colon Incidentally found on CT abdomen and pelvis with contrast done on 12/20/2017 Denies abdominal pain.  History of right adrenal carcinoma Will need to follow-up with her oncologist outpatient  Hyperkalemia, resolved Treated Potassium 6.1>> 4.4  CKD 3 Creatinine at baseline 1.10 from 1.18 Creatinine 1.28 on presentation Avoid nephrotoxic agents such as NSAIDs  Hypomagnesemia, repleted Magnesium 1.9  Small pericardial effusion Confirmed on 2D echo No wall motion abnormalities and normal LVEF Follow-up with your cardiologist    Procedures:  Right ureteral stent placement  Consultations:  Urology  Discharge Exam: BP (!) 206/34   Pulse 94   Temp 98.6 F (37 C) (Oral)   Resp 16   Ht 5\' 4"  (1.626 m)   Wt 54.4 kg   SpO2 94%   BMI 20.60 kg/m  . General: 84 y.o. year-old female well developed well nourished in no acute distress.  Alert and oriented x3. . Cardiovascular: Regular rate and rhythm with no rubs or gallops.  No thyromegaly or JVD noted.   Marland Kitchen Respiratory: Clear to auscultation with no wheezes or rales. Good inspiratory effort. . Abdomen: Soft nontender nondistended with normal bowel sounds x4 quadrants. . Musculoskeletal: No lower extremity edema. 2/4 pulses in all 4 extremities. . Skin: No ulcerative lesions noted or rashes, . Psychiatry: Mood is appropriate for condition and setting  Discharge Instructions You were cared for by a hospitalist during your hospital stay. If you have any questions about your discharge medications or the care you received while you were in the hospital after you  are discharged, you can call the unit and asked to speak with the hospitalist on call if the hospitalist that took care of you is not available. Once you are discharged, your primary care physician will handle any further medical issues. Please note that NO REFILLS for any discharge medications will be authorized once you are discharged, as it is imperative that you return to your primary care physician (or establish a relationship with a primary care physician if you do not have one) for your aftercare needs so that they can reassess your need for medications and monitor your lab values.   Allergies as of 12/25/2017      Reactions   Amoxicillin    Other reaction(s): Other (See Comments) Liver infection per pt report Caused liver infection   Clonidine Anaphylaxis   Sulfa Antibiotics Nausea Only   Sulfamethoxazole Nausea And Vomiting   Clonidine Derivatives    Metoprolol    Other reaction(s): Dizziness (intolerance) syncope      Medication List    STOP taking these medications   diphenhydrAMINE 25 mg capsule Commonly known as:  BENADRYL   telmisartan 40 MG tablet Commonly known as:  MICARDIS     TAKE these medications   amLODipine 10 MG tablet Commonly known as:  NORVASC Take 1 tablet (10 mg total) by mouth daily.   aspirin EC 81 MG tablet Take by mouth.   ciprofloxacin 500 MG tablet Commonly known as:  CIPRO Take 1 tablet (500 mg total) by mouth 2 (two) times  daily for 9 days.   clopidogrel 75 MG tablet Commonly known as:  PLAVIX TAKE 1 TABLET BY MOUTH EVERY DAY   estradiol 0.1 MG/GM vaginal cream Commonly known as:  ESTRACE Apply a pea-sized amount within vagina 2-3 times per week   ferrous sulfate 325 (65 FE) MG tablet Take 1 tablet (325 mg total) by mouth 2 (two) times daily with a meal.   hydrALAZINE 100 MG tablet Commonly known as:  APRESOLINE Take 1 tablet (100 mg total) by mouth 3 (three) times daily. What changed:  when to take this   hydrochlorothiazide  25 MG tablet Commonly known as:  HYDRODIURIL Take 1 tablet (25 mg total) by mouth daily. What changed:    how much to take  when to take this   HYDROcodone-acetaminophen 5-325 MG tablet Commonly known as:  NORCO/VICODIN Take 1 tablet by mouth 3 (three) times daily as needed for moderate pain or severe pain.   ketoconazole 2 % shampoo Commonly known as:  NIZORAL Apply 1 application topically 3 (three) times a week. Use three times weekly for two weeks.   levothyroxine 112 MCG tablet Commonly known as:  SYNTHROID, LEVOTHROID TAKE 1 TABLET (112 MCG TOTAL) BY MOUTH DAILY AT 6AM   OTEZLA PO Take 30 mg by mouth 2 (two) times daily.   polyethylene glycol packet Commonly known as:  MIRALAX / GLYCOLAX Take 17 g by mouth daily.   pravastatin 40 MG tablet Commonly known as:  PRAVACHOL TAKE 1 TABLET BY MOUTH EVERY DAY AT NIGHT   terazosin 5 MG capsule Commonly known as:  HYTRIN Take 1 capsule (5 mg total) by mouth at bedtime.      Allergies  Allergen Reactions  . Amoxicillin     Other reaction(s): Other (See Comments) Liver infection per pt report Caused liver infection   . Clonidine Anaphylaxis  . Sulfa Antibiotics Nausea Only  . Sulfamethoxazole Nausea And Vomiting  . Clonidine Derivatives   . Metoprolol     Other reaction(s): Dizziness (intolerance) syncope   Follow-up Information    Call Kathie Rhodes, MD.   Specialty:  Urology Why:  For an appointment in 1-2 weeks when you get home. Contact information: [place] [address] [phone]        [name] I, MD. Call in 1 day(s).   Specialty:  Family Medicine Why:  Please call for an appointment. Contact information: [address] [place] [address] [phone]        Campbell Stall, MD. Call in 1 day(s).   Specialty:  Cardiology Why:  Please call for an appointment. Contact information: [place]  [address] [phone]            The results of significant diagnostics from this hospitalization (including imaging, microbiology, ancillary and laboratory) are listed below for reference.    Significant Diagnostic Studies: Ct Abdomen Pelvis W Contrast  Result Date: 12/20/2017 CLINICAL DATA:  Abdominal pain, nausea vomiting since yesterday EXAM: CT ABDOMEN AND PELVIS WITH CONTRAST TECHNIQUE: Multidetector CT imaging of the abdomen and pelvis was performed using the standard protocol following bolus administration of intravenous contrast. CONTRAST:  174mL ISOVUE-300 IOPAMIDOL (ISOVUE-300) INJECTION 61% COMPARISON:  November 03 2017 FINDINGS: Lower chest: Mild atelectasis of bilateral lung bases are noted. The heart size is mildly enlarged. There is a small pericardial effusion. Hepatobiliary: No focal liver lesion is identified. Patient status post prior cholecystectomy. Intra and extrahepatic biliary ductal dilatation are identified  unchanged. Pancreas: There is a 2.2 x 2.6 cm cyst in the tail of the pancreas. A second 0.8 cm cyst is identified in the tail of the pancreas likely present on the prior CT. The pancreas is otherwise unremarkable. Spleen: Normal in size without focal abnormality. Adrenals/Urinary Tract: The left adrenal gland is normal. There is a 1.5 x 1.8 cm mixed cystic and solid enhancing mass in the anterior midpole left kidney. There is no left hydronephrosis. The patient status post prior right adrenal resection. There is atrophic right kidney with significant hydronephrosis and minimal proximal hydroureter though the majority of the right ureter is normal caliber. This is unchanged compared prior CT. There is right perinephric edema. There are 2 focus of air within the bladder lumen question recent instrumentation. Stomach/Bowel: The stomach is normal. There is no small bowel obstruction. There is mild diffuse bowel wall thickening of the left-sided transverse colon which is under  distended. There is diverticulosis of colon. The appendix is not seen but no inflammation is noted surrounding the cecum. Vascular/Lymphatic: Marked aortic atherosclerosis. No enlarged abdominal or pelvic lymph nodes. Reproductive: Status post hysterectomy. No adnexal masses. Other: None. Musculoskeletal: Degenerative joint changes of the spine are noted. IMPRESSION: Diffuse bowel wall thickening of the left side transverse colon. This can be seen in colitis. No small bowel obstruction. Mixed cystic and solid enhancing mass in the midpole left kidney. Renal cell carcinoma not excluded. Further evaluation MR of kidneys is recommended. Cysts in the tail of pancreas not significantly changed compared to prior CT. Atrophic right kidney with significant right hydronephrosis and minimal proximal hydroureter not significantly changed compared prior CT. Electronically Signed   By: Abelardo Diesel M.D.   On: 12/20/2017 11:29   Dg Chest Port 1 View  Result Date: 12/21/2017 CLINICAL DATA:  Shortness of breath. EXAM: PORTABLE CHEST 1 VIEW COMPARISON:  Chest x-ray dated November 03, 2017. FINDINGS: Stable cardiomegaly. Normal pulmonary vascularity. Atherosclerotic calcification of the aortic arch. Mild bibasilar atelectasis. No focal consolidation, pleural effusion, or pneumothorax. No acute osseous abnormality. IMPRESSION: Mild bibasilar atelectasis.  No active disease. Electronically Signed   By: Titus Dubin M.D.   On: 12/21/2017 16:17   Dg C-arm 1-60 Min-no Report  Result Date: 12/22/2017 Fluoroscopy was utilized by the requesting physician.  No radiographic interpretation.    Microbiology: Recent Results (from the past 240 hour(s))  Urine culture     Status: Abnormal   Collection Time: 12/20/17  9:01 AM  Result Value Ref Range Status   Specimen Description   Final    URINE, CLEAN CATCH Performed at [facility], [address] [place], [place] [address]    Special Requests   Final     NONE Performed at [facility], [address] [place], [place] [address]    Culture >=100,000 COLONIES/mL ESCHERICHIA COLI (A)  Final   Report Status 12/23/2017 FINAL  Final   Organism ID, Bacteria ESCHERICHIA COLI (A)  Final      Susceptibility   Escherichia coli - MIC*    AMPICILLIN 4 SENSITIVE Sensitive     CEFAZOLIN <=4 SENSITIVE Sensitive     CEFTRIAXONE <=1 SENSITIVE Sensitive     CIPROFLOXACIN <=0.25 SENSITIVE Sensitive     GENTAMICIN <=1 SENSITIVE Sensitive     IMIPENEM <=0.25 SENSITIVE Sensitive     NITROFURANTOIN <=16 SENSITIVE Sensitive     TRIMETH/SULFA <=20 SENSITIVE Sensitive     AMPICILLIN/SULBACTAM <=2 SENSITIVE Sensitive  PIP/TAZO <=4 SENSITIVE Sensitive     Extended ESBL NEGATIVE Sensitive     * >=100,000 COLONIES/mL ESCHERICHIA COLI  Culture, blood (routine x 2)     Status: None (Preliminary result)   Collection Time: 12/21/17  1:44 PM  Result Value Ref Range Status   Specimen Description   Final    BLOOD LEFT ANTECUBITAL Performed at [facility] [address], [place], [place] [address]    Special Requests   Final    BOTTLES DRAWN AEROBIC AND ANAEROBIC Blood Culture adequate volume Performed at [facility] [address], [place], [place] [address]    Culture   Final    NO GROWTH 4 DAYS Performed at [facility], [place] [address] [place], [place] [address]    Report Status PENDING  Incomplete  Culture, blood (routine x 2)     Status: None (Preliminary result)   Collection Time: 12/21/17  1:54 PM  Result Value Ref Range Status   Specimen Description   Final    BLOOD LEFT HAND Performed at [facility] [address], [place], [place] [address]    Special Requests   Final    BOTTLES DRAWN AEROBIC ONLY Blood Culture adequate volume Performed at [facility] [address], [place], [place] [address]    Culture   Final    NO GROWTH 4 DAYS Performed  at [facility], [place] [address] [place], [place] [address]    Report Status PENDING  Incomplete  MRSA PCR Screening     Status: None   Collection Time: 12/21/17  4:42 PM  Result Value Ref Range Status   MRSA by PCR NEGATIVE NEGATIVE Final    Comment:        The GeneXpert MRSA Assay (FDA approved for NASAL specimens only), is one component of a comprehensive MRSA colonization surveillance program. It is not intended to diagnose MRSA infection nor to guide or monitor treatment for MRSA infections. Performed at [facility], [place] [address] [place], [place] [address]   Urine Culture     Status: Abnormal   Collection Time: 12/22/17 12:55 PM  Result Value Ref Range Status   Specimen Description   Final    URINE, RANDOM CYSTOSCOPY Performed at [facility] [address], [place], [place] [address]    Special Requests   Final    NONE Performed at [facility], [place] [address], [place], [place] [address]    Culture 60,000 COLONIES/mL ESCHERICHIA COLI (A)  Final   Report Status 12/24/2017 FINAL  Final   Organism ID, Bacteria ESCHERICHIA COLI (A)  Final      Susceptibility   Escherichia coli - MIC*    AMPICILLIN <=2 SENSITIVE Sensitive     CEFAZOLIN <=4 SENSITIVE Sensitive     CEFTRIAXONE <=1 SENSITIVE Sensitive     CIPROFLOXACIN <=0.25 SENSITIVE Sensitive     GENTAMICIN <=1 SENSITIVE Sensitive     IMIPENEM <=0.25 SENSITIVE Sensitive     NITROFURANTOIN <=16 SENSITIVE Sensitive     TRIMETH/SULFA <=20 SENSITIVE Sensitive     AMPICILLIN/SULBACTAM <=2 SENSITIVE Sensitive     PIP/TAZO <=4 SENSITIVE Sensitive     Extended ESBL NEGATIVE Sensitive     * 60,000 COLONIES/mL ESCHERICHIA COLI     Labs: Basic Metabolic Panel: Recent Labs  Lab 12/21/17 0424 12/21/17 1344 12/22/17 0359 12/23/17 0346 12/25/17 0345  NA 144 139 135 135 135  K  6.1* 4.9 4.6 4.3 4.4  CL 112* 108 106 104 103  CO2 25 24 24 23 25   GLUCOSE  118* 131* 112* 110* 120*  BUN 23 23 24* 33* 37*  CREATININE 1.26* 1.20* 1.28* 1.18* 1.10*  CALCIUM 8.9 8.8* 8.4* 8.3* 8.2*  MG  --   --   --  1.9  --    Liver Function Tests: Recent Labs  Lab 12/20/17 0913 12/22/17 0359 12/25/17 0345  AST 39 19 14*  ALT 23 14 11   ALKPHOS 67 60 48  BILITOT 0.7 0.7 0.5  PROT 7.5 5.8* 5.6*  ALBUMIN 4.3 2.8* 2.5*   Recent Labs  Lab 12/20/17 0913  LIPASE 22   No results for input(s): AMMONIA in the last 168 hours. CBC: Recent Labs  Lab 12/20/17 0913 12/21/17 0424 12/22/17 0359 12/23/17 0346 12/24/17 0346  WBC 21.9* 22.0* 16.9* 15.6* 10.3  NEUTROABS 20.0*  --   --   --   --   HGB 11.7* 10.6* 10.0* 10.5* 9.3*  HCT 34.9* 33.7* 30.3* 32.5* 28.7*  MCV 95.6 98.8 96.2 96.4 96.3  PLT 166 145* 113* 129* 112*   Cardiac Enzymes: No results for input(s): CKTOTAL, CKMB, CKMBINDEX, TROPONINI in the last 168 hours. BNP: BNP (last 3 results) No results for input(s): BNP in the last 8760 hours.  ProBNP (last 3 results) No results for input(s): PROBNP in the last 8760 hours.  CBG: No results for input(s): GLUCAP in the last 168 hours.     Signed:  Kayleen Memos, MD [facility] 12/25/2017, 3:31 PM

## 2017-12-25 NOTE — NC FL2 (Signed)
[employer] LEVEL OF CARE SCREENING TOOL     IDENTIFICATION  Patient Name: Brooke Walker Birthdate: 1930-12-12 Sex: female Admission Date (Current Location): 12/20/2017  [facility] and [place] Number:  Herbalist and Address:  [facility],  [place], [place]      Provider Number: [id number]  Attending Physician Name and Address:  Kayleen Memos, DO  Relative Name and Phone Number:       Current Level of Care: Hospital Recommended Level of Care: [place] Prior Approval Number:    Date Approved/Denied:   PASRR Number: [id number]  Discharge Plan: SNF    Current Diagnoses: Patient Active Problem List   Diagnosis Date Noted  . Pyelonephritis 12/20/2017  . Hyperglycemia 12/20/2017  . Essential hypertension 12/20/2017  . Left renal mass 12/20/2017  . Hydronephrosis of right kidney 12/20/2017  . Arrhythmia 12/20/2017  . Pancreatic cyst 12/20/2017    Orientation RESPIRATION BLADDER Height & Weight     Self, Time, Situation, Place  Normal Continent Weight: 120 lb (54.4 kg) Height:  5\' 4"  (162.6 cm)  BEHAVIORAL SYMPTOMS/MOOD NEUROLOGICAL BOWEL NUTRITION STATUS      Continent Diet(Heart Healthy )  AMBULATORY STATUS COMMUNICATION OF NEEDS Skin   Extensive Assist   Normal                       Personal Care Assistance Level of Assistance  Bathing, Feeding, Dressing Bathing Assistance: Limited assistance Feeding assistance: Independent Dressing Assistance: Limited assistance     Functional Limitations Info  Sight, Hearing, Speech Sight Info: Impaired(Wears Glasses. ) Hearing Info: Adequate Speech Info: Adequate    SPECIAL CARE FACTORS FREQUENCY  PT (By licensed PT), OT (By licensed OT)     PT Frequency: 5x/week  OT Frequency: 5x/week             Contractures Contractures Info: Not present    Additional Factors Info  Code Status, Allergies Code Status Info: DNR  Allergies Info:  Amoxicillin, Clonidine, Sulfa Antibiotics, Sulfamethoxazole, Clonidine Derivatives, Metoprolol           Current Medications (12/25/2017):  This is the current hospital active medication list Current Facility-Administered Medications  Medication Dose Route Frequency Provider Last Rate Last Dose  . acetaminophen (TYLENOL) tablet 650 mg  650 mg Oral Q6H PRN Karmen Bongo, MD   650 mg at 12/25/17 1252   Or  . acetaminophen (TYLENOL) suppository 650 mg  650 mg Rectal Q6H PRN Karmen Bongo, MD      . amLODipine (NORVASC) tablet 10 mg  10 mg Oral Daily Karmen Bongo, MD   10 mg at 12/25/17 0940  . aspirin EC tablet 81 mg  81 mg Oral Daily Karmen Bongo, MD   81 mg at 12/25/17 0940  . ciprofloxacin (CIPRO) tablet 500 mg  500 mg Oral BID Irene Pap N, DO   500 mg at 12/25/17 5170  . clopidogrel (PLAVIX) tablet 75 mg  75 mg Oral Q breakfast Karmen Bongo, MD   75 mg at 12/25/17 0827  . docusate sodium (COLACE) capsule 100 mg  100 mg Oral BID Karmen Bongo, MD   100 mg at 12/25/17 0940  . enoxaparin (LOVENOX) injection 30 mg  30 mg Subcutaneous Q24H Minda Ditto, RPH   30 mg at 12/24/17 2146  . ferrous sulfate tablet 325 mg  325 mg Oral BID WC Hall, Carole N, DO   325 mg at 12/25/17 0827  . hydrALAZINE (  APRESOLINE) injection 10 mg  10 mg Intravenous Q4H PRN Irene Pap N, DO   10 mg at 12/24/17 1751  . hydrALAZINE (APRESOLINE) tablet 100 mg  100 mg Oral Lynne Logan, MD   100 mg at 12/25/17 1301  . HYDROcodone-acetaminophen (NORCO/VICODIN) 5-325 MG per tablet 1-2 tablet  1-2 tablet Oral Q6H PRN Schorr, Rhetta Mura, NP   1 tablet at 12/25/17 0937  . levothyroxine (SYNTHROID, LEVOTHROID) tablet 112 mcg  112 mcg Oral QAC breakfast Karmen Bongo, MD   112 mcg at 12/25/17 [id number]  . MEDLINE mouth rinse  15 mL Mouth Rinse BID Irene Pap N, DO   15 mL at 12/25/17 0941  . ondansetron (ZOFRAN) tablet 4 mg  4 mg Oral Q6H PRN Karmen Bongo, MD       Or  . ondansetron [facility]) injection  4 mg  4 mg Intravenous Q6H PRN Karmen Bongo, MD   4 mg at 12/21/17 1452  . polyethylene glycol (MIRALAX / GLYCOLAX) packet 17 g  17 g Oral Daily Hall, Carole N, DO      . pravastatin (PRAVACHOL) tablet 40 mg  40 mg Oral q1800 Karmen Bongo, MD   40 mg at 12/24/17 1739  . terazosin (HYTRIN) capsule 5 mg  5 mg Oral QHS Irene Pap N, DO   5 mg at 12/24/17 2145     Discharge Medications: Please see discharge summary for a list of discharge medications.  Relevant Imaging Results:  Relevant Lab Results:   Additional Information ssn: [id number]  Lia Hopping, LCSW

## 2017-12-25 NOTE — Progress Notes (Addendum)
Patient lives at [facility]. Patient and daughter do not feel safe returning to Independent living at this time due to patient need for assistance with ambulating.  CSW discussed patient concerns with Joellen Jersey at [facility]. She reports they do not have beds on there campus but will have a bed at [place] contacted the [facility] and sent FL2 and D/C summary to facility.   Plan: SNF for short rehab.  [facility] arranged for transport.    Kathrin Greathouse, Marlinda Mike, MSW Clinical Social Worker  [phone] 12/25/2017  3:34 PM

## 2017-12-25 NOTE — Progress Notes (Signed)
Called report [facility] at [facility]. She will be taking pt.

## 2017-12-25 NOTE — Progress Notes (Signed)
PHYSICAL THERAPY  SATURATION QUALIFICATIONS: (This note is used to comply with regulatory documentation for home oxygen)  Patient Saturations on Room Air at Rest = 95%  Patient Saturations on Room Air while Ambulating 65 feet = 93%  Patient Saturations on              Liters of oxygen while Ambulating =                 %  Please briefly explain why patient needs home oxygen:   Pt does NOT require supplemental oxygen  Rica Koyanagi  PTA [facility]  Acute  Rehab Pager      [phone]

## 2017-12-25 NOTE — Progress Notes (Signed)
Patient ID: Brooke Walker, female   DOB: 09/01/1930, 84 y.o.   MRN: [id number]    Assessment: 1.  Right pyelonephrosis: Her right kidney is now drained and her white blood cell count has returned to normal with no fever.  Her stent will remain indwelling and I will see her in follow-up as an outpatient.  2.  Right hydronephrosis:  She was found to have what appeared to be either panurethral stricture disease or some other acute process (inflammation, ureteritis cystica or possibly transitional cell carcinoma) occurring throughout the ureter resulting in her right hydronephrosis.  This could not be evaluated due to the presence of frank pus present in her kidney so this will need further evaluation once she has been treated with antibiotics.   Plan:  1.  She will follow-up with me approximately 1 week after discharge. 2.  Would recommend continuing oral antibiotics upon discharge for a total of 2 weeks of antibiotic therapy.   Subjective: Patient reports that she still has some slight right flank pain but it is markedly improved.  She also reports some mild irritation with urination likely secondary to her stent.  Objective: Vital signs in last 24 hours: Temp:  [97.6 F (36.4 C)-99.6 F (37.6 C)] 97.8 F (36.6 C) (08/12 0400) Pulse Rate:  [80-103] 98 (08/12 0500) Resp:  [12-20] 14 (08/12 0500) BP: (150-226)/(30-48) 184/33 (08/12 0500) SpO2:  [90 %-99 %] 95 % (08/12 0500)A  Intake/Output from previous day: 08/11 0701 - 08/12 0700 In: 2.1 [IV Piggyback:2.1] Out: -  Intake/Output this shift: No intake/output data recorded.  Past Medical History:  Diagnosis Date  . Adrenal cancer, right ([place])   . AKI (acute kidney injury) ([place])   . Anemia   . CKD (chronic kidney disease)   . Coronary artery disease   . Hyperlipidemia   . Hypertension   . Mesenteric ischemia ([place])   . Pancreatic cyst   . Pericardial effusion   . Peripheral polyneuropathy   . Psoriasis   . Renal insufficiency     . Restless leg syndrome   . SVT (supraventricular tachycardia) ([place])   . Thyroid disease   . Transient ischemic attack (TIA)     Physical Exam:  General: Awake, alert and in no apparent distress Lungs: Normal respiratory effort, chest expands symmetrically.  Abdomen: Soft, non-tender & non-distended. Back: Minimal right CVAT.  Lab Results: Recent Labs    12/23/17 0346 12/24/17 0346  WBC 15.6* 10.3  HGB 10.5* 9.3*  HCT 32.5* 28.7*   BMET Recent Labs    12/23/17 0346 12/25/17 0345  NA 135 135  K 4.3 4.4  CL 104 103  CO2 23 25  GLUCOSE 110* 120*  BUN 33* 37*  CREATININE 1.18* 1.10*  CALCIUM 8.3* 8.2*   No results for input(s): LABURIN in the last 72 hours. Results for orders placed or performed during the hospital encounter of 12/20/17  Urine culture     Status: Abnormal   Collection Time: 12/20/17  9:01 AM  Result Value Ref Range Status   Specimen Description   Final    URINE, CLEAN CATCH Performed at [facility], [address] [place], [place] [address]    Special Requests   Final    NONE Performed at [facility], [address] [place], [place] [address]    Culture >=100,000 COLONIES/mL ESCHERICHIA COLI (A)  Final   Report Status 12/23/2017 FINAL  Final   Organism ID, Bacteria ESCHERICHIA COLI (A)  Final      Susceptibility   Escherichia coli - MIC*    AMPICILLIN 4 SENSITIVE Sensitive     CEFAZOLIN <=4 SENSITIVE Sensitive     CEFTRIAXONE <=1 SENSITIVE Sensitive     CIPROFLOXACIN <=0.25 SENSITIVE Sensitive     GENTAMICIN <=1 SENSITIVE Sensitive     IMIPENEM <=0.25 SENSITIVE Sensitive     NITROFURANTOIN <=16 SENSITIVE Sensitive     TRIMETH/SULFA <=20 SENSITIVE Sensitive     AMPICILLIN/SULBACTAM <=2 SENSITIVE Sensitive     PIP/TAZO <=4 SENSITIVE Sensitive     Extended ESBL NEGATIVE Sensitive     * >=100,000 COLONIES/mL ESCHERICHIA COLI  Culture, blood (routine x 2)     Status: None (Preliminary result)   Collection Time:  12/21/17  1:44 PM  Result Value Ref Range Status   Specimen Description   Final    BLOOD LEFT ANTECUBITAL Performed at [facility] [address] [place], [place] [address]    Special Requests   Final    BOTTLES DRAWN AEROBIC AND ANAEROBIC Blood Culture adequate volume Performed at [facility] [address] [place], [place] [address]    Culture   Final    NO GROWTH 3 DAYS Performed at [facility], [place] [address], [place], [place] [address]    Report Status PENDING  Incomplete  Culture, blood (routine x 2)     Status: None (Preliminary result)   Collection Time: 12/21/17  1:54 PM  Result Value Ref Range Status   Specimen Description   Final    BLOOD LEFT HAND Performed at [facility] [address] [place], [place] [address]    Special Requests   Final    BOTTLES DRAWN AEROBIC ONLY Blood Culture adequate volume Performed at [facility] [address] [place], [place] [address]    Culture   Final    NO GROWTH 3 DAYS Performed at [facility], [place] [address] [place], [place] [address]    Report Status PENDING  Incomplete  MRSA PCR Screening     Status: None   Collection Time: 12/21/17  4:42 PM  Result Value Ref Range Status   MRSA by PCR NEGATIVE NEGATIVE Final    Comment:        The GeneXpert MRSA Assay (FDA approved for NASAL specimens only), is one component of a comprehensive MRSA colonization surveillance program. It is not intended to diagnose MRSA infection nor to guide or monitor treatment for MRSA infections. Performed at [facility], [place] [address], [place], [place] [address]   Urine Culture     Status: Abnormal   Collection Time: 12/22/17 12:55 PM  Result Value Ref Range Status   Specimen Description   Final    URINE, RANDOM CYSTOSCOPY Performed at [facility] [address], [place], [place] [address]    Special  Requests   Final    NONE Performed at [facility], [place] [address] [place], [place] [address]    Culture 60,000 COLONIES/mL ESCHERICHIA COLI (A)  Final   Report Status 12/24/2017 FINAL  Final   Organism ID, Bacteria ESCHERICHIA COLI (A)  Final      Susceptibility   Escherichia coli - MIC*    AMPICILLIN <=2 SENSITIVE Sensitive     CEFAZOLIN <=4 SENSITIVE Sensitive     CEFTRIAXONE <=1 SENSITIVE Sensitive     CIPROFLOXACIN <=0.25 SENSITIVE Sensitive     GENTAMICIN <=1 SENSITIVE  Sensitive     IMIPENEM <=0.25 SENSITIVE Sensitive     NITROFURANTOIN <=16 SENSITIVE Sensitive     TRIMETH/SULFA <=20 SENSITIVE Sensitive     AMPICILLIN/SULBACTAM <=2 SENSITIVE Sensitive     PIP/TAZO <=4 SENSITIVE Sensitive     Extended ESBL NEGATIVE Sensitive     * 60,000 COLONIES/mL ESCHERICHIA COLI    Studies/Results: No results found.    Devynne Sturdivant C 12/25/2017, 7:06 AM

## 2017-12-25 NOTE — Discharge Instructions (Signed)
Hydronephrosis Hydronephrosis is the enlargement of a kidney due to a blockage that stops urine from flowing out of the body. What are the causes? Common causes of this condition include:  A birth (congenital) defect of the kidney.  A congenital defect of the tube through which urine travels (ureter).  Kidney stones.  An enlarged prostate gland.  A tumor.  Cancer of the prostate, bladder, uterus, ovary, or colon.  A blood clot.  What are the signs or symptoms? Symptoms of this condition include:  Pain or discomfort in your side (flank).  Swelling of the abdomen.  Pain in the abdomen.  Nausea and vomiting.  Fever.  Pain while passing urine.  Feeling of urgency to urinate.  Frequent urination.  Infection of the urinary tract.  In some cases, there are no symptoms. How is this diagnosed? This condition may be diagnosed with:  A medical history.  A physical exam.  Blood and urine tests to check kidney function.  Imaging tests, such as an X-ray, ultrasound, CT scan, or MRI.  A test in which a rigid or flexible telescope (cystoscope) is used to view the site of the blockage.  How is this treated? Treatment for this condition depends on where the blockage is located, how long it has been there, and what caused it. The goal of treatment is to remove the blockage. Treatment options include:  A procedure to put in a soft tube to help drain urine.  Antibiotic medicines to treat or prevent infection.  Shock-wave therapy (lithotripsy) to help eliminate kidney stones.  Follow these instructions at home:  Get lots of rest.  Drink enough fluid to keep your urine clear or pale yellow.  If you have a drain in, follow your health care provider's instructions about how to care for it.  Take medicines only as directed by your health care provider.  If you were prescribed an antibiotic medicine, finish all of it even if you start to feel better.  Keep all  follow-up visits as directed by your health care provider. This is important. Contact a health care provider if:  You continue to have symptoms after treatment.  You develop new symptoms.  You have a problem with a drainage device.  Your urine becomes cloudy or bloody.  You have a fever. Get help right away if:  You have severe flank or abdominal pain.  You develop vomiting and are unable to keep fluids down. This information is not intended to replace advice given to you by your health care provider. Make sure you discuss any questions you have with your health care provider. Document Released: 02/27/2007 Document Revised: 10/08/2015 Document Reviewed: 04/28/2014 Elsevier Interactive Patient Education  2018 [employer].   Pyelonephritis, Adult Pyelonephritis is a kidney infection. The kidneys are organs that help clean your blood by moving waste out of your blood and into your pee (urine). This infection can happen quickly, or it can last for a long time. In most cases, it clears up with treatment and does not cause other problems. Follow these instructions at home: Medicines  Take over-the-counter and prescription medicines only as told by your doctor.  Take your antibiotic medicine as told by your doctor. Do not stop taking the medicine even if you start to feel better. General instructions  Drink enough fluid to keep your pee clear or pale yellow.  Avoid caffeine, tea, and carbonated drinks.  Pee (urinate) often. Avoid holding in pee for long periods of time.  Pee  before and after sex.  After pooping (having a bowel movement), women should wipe from front to back. Use each tissue only once.  Keep all follow-up visits as told by your doctor. This is important. Contact a doctor if:  You do not feel better after 2 days.  Your symptoms get worse.  You have a fever. Get help right away if:  You cannot take your medicine or drink fluids as told.  You have chills  and shaking.  You throw up (vomit).  You have very bad pain in your side (flank) or back.  You feel very weak or you pass out (faint). This information is not intended to replace advice given to you by your health care provider. Make sure you discuss any questions you have with your health care provider. Document Released: 06/09/2004 Document Revised: 10/08/2015 Document Reviewed: 08/25/2014 Elsevier Interactive Patient Education  2018 [place] stent placement instructions   Definitions:  Ureter: The duct that transports urine from the kidney to the bladder. Stent: A plastic hollow tube that is placed into the ureter, from the kidney to the bladder to prevent the ureter from swelling shut.  General instructions:  Despite the fact that no skin incisions were used, the area around the ureter and bladder is raw and irritated. The stent is a foreign body which can further irritate the bladder wall. This irritation is manifested by increased frequency of urination, both day and night, and by an increase in the urge to urinate. In some, the urge to urinate is present almost always. Sometimes the urge is strong enough that you may not be able to stop your self from urinating. This can often be controlled with medication but does not occur in everyone. A stent can safely be left in place for 3 months or greater.  You may see some blood in your urine while the stent is in place and a few days afterward. Do not be alarmed, even if the urine is clear for a while. Get off your feet and drink lots of fluids until clearing occurs. If you start to pass clots or don't improve, call us.  Diet:  You may return to your normal diet immediately. Because of the raw surface of your bladder, alcohol, spicy foods, foods high in acid and drinks with caffeine may cause irritation or frequency and should be used in moderation. To keep your urine flowing freely and avoid constipation, drink plenty of fluids  during the day (8-10 glasses). Tip: Avoid cranberry juice because it is very acidic.  Activity:  Your physical activity doesn't need to be restricted. However, if you are very active, you may see some blood in the urine. We suggest that you reduce your activity under the circumstances until the bleeding has stopped.  Bowels:  It is important to keep your bowels regular during the postoperative period. Straining with bowel movements can cause bleeding. A bowel movement every other day is reasonable. Use a mild laxative if needed, such as milk of magnesia 2-3 tablespoons, or 2 Dulcolax tablets. Call if you continue to have problems. If you had been taking narcotics for pain, before, during or after your surgery, you may be constipated. Take a laxative if necessary.  Medication:  You should resume your pre-surgery medications unless told not to. In addition you may be given an antibiotic to prevent or treat infection. Antibiotics are not always necessary. All medication should be taken as prescribed until the bottles are finished unless you are  having an unusual reaction to one of the drugs.  Problems you should report to [place]:  a. Fever greater than 101F. b. Heavy bleeding, or clots (see notes above about blood in urine). c. Inability to urinate. d. Drug reactions (hives, rash, nausea, vomiting, diarrhea). e. Severe burning or pain with urination that is not improving.

## 2017-12-25 NOTE — Progress Notes (Signed)
Pt. Not feeling well today. She is concerned about her orders for discharge and she is saying her pain is much worse today at a 6/10. Pain medication given and pt. Still in 6/10 pain. Pt. Is not able to get to the bathroom, she uses the bedside commode with assistance. MD paged to be made aware. PT to come re-evaluate pt today. Will continue to monitor pt. Closely.

## 2017-12-25 NOTE — Progress Notes (Signed)
Physical Therapy Treatment Patient Details Name: Brooke Walker MRN: [id number] DOB: April 02, 1931 Today's Date: 12/25/2017    History of Present Illness Pt admitted with pyelonephritis and s/p R Double - J stent placement on 01/22/18.  Pt with hx of TIA, SVT, CAD adrenal CA and peripheral polyneuropathy    PT Comments    Pt in bed on 2 lts 97%.  RN requesting RA sats during amb.  Daughter in room concerned about pt being D/C today back to her Indep apartment at [facility].  Last session pt amb 200 feet but needed Min Assist and declined the use of a walker.  Assisted pt OOB to amb in hallway on RA and used a 4WW Rollator.  Pt still required Min Assist with decreased distance due to increased c/o R side pain and some nausea.  Unsteady, weak gait with poor upright posture.  Assisted to bathroom required Min Assist to prevent fall from weakness/fatigue.  "I feel so tired" stated pt.  Assisted back to bed.  Pt unable to tolerate any other activity.   Pt present with weakness, instability and MAX c/o fatigue.  Pt will need ST Rehab at SNF vs return to [facility]ep [facility].  Daughter and RN in room agree.  Updated and consulted LPT Sharron for change in D/C need.   Follow Up Recommendations  SNF([facility]) before returning to [employer] Recommendations  (rollator) if pt will agree   Recommendations for Other Services       Precautions / Restrictions Precautions Precautions: Fall Restrictions Weight Bearing Restrictions: No    Mobility  Bed Mobility Overal bed mobility: Needs Assistance Bed Mobility: Supine to Sit;Sit to Supine     Supine to sit: Supervision Sit to supine: Supervision   General bed mobility comments: increased time and use of rail due to increased c/o pain  Transfers Overall transfer level: Needs assistance Equipment used: 4-wheeled walker Transfers: Sit to/from [employer] Transfers Sit to Stand: Min assist         General transfer comment: assist to  steady on standing  Ambulation/Gait Ambulation/Gait assistance: Min assist Gait Distance (Feet): 65 Feet Assistive device: 4-wheeled walker Gait Pattern/deviations: Step-through pattern Gait velocity: decreased   General Gait Details: decreased amb distance due to increased c/o weakness and increased R side pain.  Also, c/o nausea with amb.  Unsteady even with rollator.  Prior pt was Indep with no AD.   Stairs             Wheelchair Mobility    Modified Rankin (Stroke Patients Only)       Balance                                            Cognition Arousal/Alertness: Awake/alert Behavior During Therapy: WFL for tasks assessed/performed                                   General Comments: very "tired" and "some what" nausea with activity      Exercises      General Comments        Pertinent Vitals/Pain Pain Assessment: Faces Faces Pain Scale: Hurts even more Pain Location: R side near surgical site (stent) Pain Descriptors / Indicators: Aching;Discomfort;Guarding;Grimacing Pain Intervention(s): Monitored during session;Repositioned;Premedicated before session(Tylenol)    Home  Living                      Prior Function            PT Goals (current goals can now be found in the care plan section) Progress towards PT goals: Progressing toward goals    Frequency    Min 3X/week      PT Plan Discharge plan needs to be updated    Co-evaluation              AM-PAC PT "6 Clicks" Daily Activity  Outcome Measure  Difficulty turning over in bed (including adjusting bedclothes, sheets and blankets)?: A Lot Difficulty moving from lying on back to sitting on the side of the bed? : A Lot Difficulty sitting down on and standing up from a chair with arms (e.g., wheelchair, bedside commode, etc,.)?: A Lot Help needed moving to and from a bed to chair (including a wheelchair)?: A Lot Help needed walking in  hospital room?: A Lot Help needed climbing 3-5 steps with a railing? : Total 6 Click Score: 11    End of Session Equipment Utilized During Treatment: Gait belt Activity Tolerance: Patient limited by pain;Patient limited by fatigue Patient left: in bed;with bed alarm set;with nursing/sitter in room;with family/visitor present Nurse Communication: Mobility status(RA avg 93%) PT Visit Diagnosis: Unsteadiness on feet (R26.81);Muscle weakness (generalized) (M62.81);Difficulty in walking, not elsewhere classified (R26.2)     Time: 9977-4142 PT Time Calculation (min) (ACUTE ONLY): 25 min  Charges:  $Gait Training: 8-22 mins $Therapeutic Activity: 8-22 mins                     Rica Koyanagi  PTA [facility]  Acute  Rehab Pager      [phone]

## 2017-12-26 LAB — FOLATE RBC
Folate, Hemolysate: 296.6 ng/mL
Folate, RBC: 1111 ng/mL (ref >498)
Hematocrit: 26.7 % — ABNORMAL LOW (ref 34.0–46.6)

## 2017-12-26 LAB — HAPTOGLOBIN: HAPTOGLOBIN: 258 mg/dL — AB (ref 34–200)

## 2017-12-26 LAB — CULTURE, BLOOD (ROUTINE X 2)
Culture: NO GROWTH
Culture: NO GROWTH
Special Requests: ADEQUATE
Special Requests: ADEQUATE

## 2017-12-27 ENCOUNTER — Non-Acute Institutional Stay (SKILLED_NURSING_FACILITY): Payer: Medicare Other | Admitting: Internal Medicine

## 2017-12-27 ENCOUNTER — Encounter: Payer: Self-pay | Admitting: Internal Medicine

## 2017-12-27 DIAGNOSIS — I10 Essential (primary) hypertension: Secondary | ICD-10-CM | POA: Diagnosis not present

## 2017-12-27 DIAGNOSIS — I2510 Atherosclerotic heart disease of native coronary artery without angina pectoris: Secondary | ICD-10-CM | POA: Diagnosis not present

## 2017-12-27 DIAGNOSIS — R5381 Other malaise: Secondary | ICD-10-CM

## 2017-12-27 DIAGNOSIS — B9620 Unspecified Escherichia coli [E. coli] as the cause of diseases classified elsewhere: Secondary | ICD-10-CM

## 2017-12-27 DIAGNOSIS — N12 Tubulo-interstitial nephritis, not specified as acute or chronic: Secondary | ICD-10-CM

## 2017-12-27 DIAGNOSIS — L409 Psoriasis, unspecified: Secondary | ICD-10-CM | POA: Insufficient documentation

## 2017-12-27 DIAGNOSIS — E785 Hyperlipidemia, unspecified: Secondary | ICD-10-CM | POA: Insufficient documentation

## 2017-12-27 DIAGNOSIS — N390 Urinary tract infection, site not specified: Secondary | ICD-10-CM

## 2017-12-27 DIAGNOSIS — E039 Hypothyroidism, unspecified: Secondary | ICD-10-CM | POA: Insufficient documentation

## 2017-12-27 DIAGNOSIS — D638 Anemia in other chronic diseases classified elsewhere: Secondary | ICD-10-CM | POA: Insufficient documentation

## 2017-12-27 DIAGNOSIS — N183 Chronic kidney disease, stage 3 unspecified: Secondary | ICD-10-CM

## 2017-12-27 DIAGNOSIS — K5900 Constipation, unspecified: Secondary | ICD-10-CM

## 2017-12-27 NOTE — Progress Notes (Signed)
Provider:  Blanchie Serve MD  Location:  [place] Room Number: 75 Place of Service:  SNF [phone])  PCP: Lauraine Rinne, MD Patient Care Team: Lauraine Rinne, MD as PCP - General (Family Medicine)  Extended Emergency Contact Information Primary Emergency Contact: Leata Mouse Mobile Phone: [phone] Relation: Daughter Secondary Emergency Contact: Dala Dock Mobile Phone: [phone] Relation: Daughter Interpreter needed? No  Code Status: full code  Goals of Care: Advanced Directive information Advanced Directives 12/22/2017  Does Patient Have a Medical Advance Directive? No  Would patient like information on creating a medical advance directive? No - Patient declined      Chief Complaint  Patient presents with  . New Admit To SNF    New Admission Visit     HPI: Patient is a 84 y.o. female seen today for admission visit. She was in the hospital from 12/20/17-12/25/17 with severe right flank pain. She was found to have E.coli UTI and right hydronephrosis with pyelonephritis. She was seen by urology and underwent cystoscopy with right retrograde ureteropyelogram and J stent placement. She was placed on iv antibiotics. Her hospital stay got complicated by acute hypoxic respiratory failure secondary to pulmonary edema and hypertensive emergency. She required diuresis and antihypertensive were adjusted. She was placed on iron supplement for anemia. She has medical history of right adrenal cancer s/p radiation, hypertension, TIA, CAD, HLD and ckd stage 3 among others. She is seen in her room today.   Past Medical History:  Diagnosis Date  . Adrenal cancer, right ([place])   . AKI (acute kidney injury) ([place])   . Anemia   . CKD (chronic kidney disease)   . Coronary artery disease   . Hyperlipidemia   . Hypertension   . Mesenteric ischemia ([place])   . Pancreatic cyst   . Pericardial effusion   . Peripheral polyneuropathy   . Psoriasis   . Renal  insufficiency   . Restless leg syndrome   . SVT (supraventricular tachycardia) ([place])   . Thyroid disease   . Transient ischemic attack (TIA)    Past Surgical History:  Procedure Laterality Date  . ABDOMINAL HYSTERECTOMY  1975  . ADRENALECTOMY Right    with radiation  . CYSTOSCOPY WITH RETROGRADE PYELOGRAM, URETEROSCOPY AND STENT PLACEMENT Right 12/22/2017   Procedure: CYSTOSCOPY WITH RETROGRADE PYELOGRAM, AND STENT PLACEMENT;  Surgeon: Kathie Rhodes, MD;  Location: [facility];  Service: Urology;  Laterality: Right;  . mesenteric ischemia stents    . URETERAL STENT PLACEMENT      reports that she has never smoked. She has never used smokeless tobacco. She reports that she does not drink alcohol or use drugs. Social History   Socioeconomic History  . Marital status: Widowed    Spouse name: Not on file  . Number of children: Not on file  . Years of education: Not on file  . Highest education level: Not on file  Occupational History  . Occupation: retired  Scientific laboratory technician  . Financial resource strain: Not on file  . Food insecurity:    Worry: Not on file    Inability: Not on file  . Transportation needs:    Medical: Not on file    Non-medical: Not on file  Tobacco Use  . Smoking status: Never Smoker  . Smokeless tobacco: Never Used  Substance and Sexual Activity  . Alcohol use: Never    Frequency: Never  . Drug use: Never  . Sexual activity: Not on file  Lifestyle  .  Physical activity:    Days per week: Not on file    Minutes per session: Not on file  . Stress: Not on file  Relationships  . Social connections:    Talks on phone: Not on file    Gets together: Not on file    Attends religious service: Not on file    Active member of club or organization: Not on file    Attends meetings of clubs or organizations: Not on file    Relationship status: Not on file  . Intimate partner violence:    Fear of current or ex partner: Not on file    Emotionally abused: Not on file     Physically abused: Not on file    Forced sexual activity: Not on file  Other Topics Concern  . Not on file  Social History Narrative  . Not on file    Functional Status Survey:    History reviewed. No pertinent family history.  Health Maintenance  Topic Date Due  . TETANUS/TDAP  02/11/1952  . DEXA SCAN  02/10/1998  . PNA vac Low Risk Adult (1 of 2 - PCV13) 02/10/1998  . INFLUENZA VACCINE  12/14/2017    Allergies  Allergen Reactions  . Amoxicillin     Other reaction(s): Other (See Comments) Liver infection per pt report Caused liver infection   . Clonidine Anaphylaxis  . Sulfa Antibiotics Nausea Only  . Sulfamethoxazole Nausea And Vomiting  . Clonidine Derivatives   . Metoprolol     Other reaction(s): Dizziness (intolerance) syncope    Outpatient Encounter Medications as of 12/27/2017  Medication Sig  . acetaminophen (TYLENOL) 325 MG tablet Take 650 mg by mouth every 4 (four) hours as needed. Stop date 12/27/17  . amLODipine (NORVASC) 10 MG tablet Take 1 tablet (10 mg total) by mouth daily.  Marland Kitchen Apremilast (OTEZLA PO) Take 30 mg by mouth 2 (two) times daily.   Marland Kitchen aspirin EC 81 MG tablet Take by mouth.  . ciprofloxacin (CIPRO) 500 MG tablet Take 1 tablet (500 mg total) by mouth 2 (two) times daily for 9 days.  . clopidogrel (PLAVIX) 75 MG tablet TAKE 1 TABLET BY MOUTH EVERY DAY  . estradiol (ESTRACE) 0.1 MG/GM vaginal cream Apply a pea-sized amount within vagina 2-3 times per week  . ferrous sulfate 325 (65 FE) MG tablet Take 1 tablet (325 mg total) by mouth 2 (two) times daily with a meal.  . hydrALAZINE (APRESOLINE) 100 MG tablet Take 1 tablet (100 mg total) by mouth 3 (three) times daily.  . hydrochlorothiazide (HYDRODIURIL) 25 MG tablet Take 1 tablet (25 mg total) by mouth daily.  Marland Kitchen HYDROcodone-acetaminophen (NORCO/VICODIN) 5-325 MG tablet Take 1 tablet by mouth 3 (three) times daily as needed for moderate pain or severe pain.  Marland Kitchen ketoconazole (NIZORAL) 2 % shampoo Apply  1 application topically 3 (three) times a week. Use three times weekly for two weeks.  Marland Kitchen levothyroxine (SYNTHROID, LEVOTHROID) 112 MCG tablet TAKE 1 TABLET (112 MCG TOTAL) BY MOUTH DAILY AT 6AM  . polyethylene glycol (MIRALAX / GLYCOLAX) packet Take 17 g by mouth daily.  . pravastatin (PRAVACHOL) 40 MG tablet TAKE 1 TABLET BY MOUTH EVERY DAY AT NIGHT  . saccharomyces boulardii (FLORASTOR) 250 MG capsule Take 250 mg by mouth 2 (two) times daily. Stop date 01/04/18  . terazosin (HYTRIN) 5 MG capsule Take 1 capsule (5 mg total) by mouth at bedtime.   No facility-administered encounter medications on file as of 12/27/2017.  Review of Systems  Constitutional: Negative for appetite change, chills and fever.  HENT: Negative for congestion, ear pain, hearing loss, mouth sores, postnasal drip, rhinorrhea, sore throat and trouble swallowing.   Eyes: Positive for visual disturbance.  Respiratory: Negative for cough, shortness of breath and wheezing.   Cardiovascular: Negative for chest pain and palpitations.  Gastrointestinal: Positive for abdominal pain, constipation and nausea. Negative for blood in stool, diarrhea and vomiting.       Right lower abdominal discomfort at times, occasional nausea, had a small bowel movement yesterday, was taking stool softener at home  Genitourinary: Positive for flank pain. Negative for dysuria, frequency, hematuria and pelvic pain.  Musculoskeletal: Negative for arthralgias, back pain and gait problem.  Neurological: Negative for dizziness and headaches.  Psychiatric/Behavioral: Negative for confusion.    Vitals:   12/27/17 0936  BP: (!) 186/90  Pulse: 93  Resp: 18  Temp: 97.7 F (36.5 C)  TempSrc: Oral  SpO2: 93%  Weight: 123 lb (55.8 kg)  Height: 5\' 4"  (1.626 m)   Body mass index is 21.11 kg/m. Physical Exam  Constitutional: She is oriented to person, place, and time. She appears well-developed and well-nourished. No distress.  HENT:  Head:  Normocephalic and atraumatic.  Nose: Nose normal.  Mouth/Throat: Oropharynx is clear and moist. No oropharyngeal exudate.  Eyes: Pupils are equal, round, and reactive to light. Conjunctivae and EOM are normal. Right eye exhibits no discharge. Left eye exhibits no discharge.  Has corrective glasses  Neck: Normal range of motion. Neck supple.  Cardiovascular: Normal rate and regular rhythm.  Murmur heard. Pulmonary/Chest: Effort normal and breath sounds normal. No respiratory distress. She has no wheezes. She has no rales.  Abdominal: Soft. Bowel sounds are normal. There is no tenderness. There is no rebound and no guarding.  Musculoskeletal: Normal range of motion. She exhibits no edema.  Can move all 4 extremities, unsteady gait, uses walker  Lymphadenopathy:    She has no cervical adenopathy.  Neurological: She is alert and oriented to person, place, and time. She exhibits normal muscle tone.  Skin: Skin is warm and dry. She is not diaphoretic.  Psychiatric: She has a normal mood and affect.    Labs reviewed: Basic Metabolic Panel: Recent Labs    12/22/17 0359 12/23/17 0346 12/25/17 0345  NA 135 135 135  K 4.6 4.3 4.4  CL 106 104 103  CO2 24 23 25   GLUCOSE 112* 110* 120*  BUN 24* 33* 37*  CREATININE 1.28* 1.18* 1.10*  CALCIUM 8.4* 8.3* 8.2*  MG  --  1.9  --    Liver Function Tests: Recent Labs    12/20/17 0913 12/22/17 0359 12/25/17 0345  AST 39 19 14*  ALT 23 14 11   ALKPHOS 67 60 48  BILITOT 0.7 0.7 0.5  PROT 7.5 5.8* 5.6*  ALBUMIN 4.3 2.8* 2.5*   Recent Labs    11/03/17 1046 12/20/17 0913  LIPASE 21 22   No results for input(s): AMMONIA in the last 8760 hours. CBC: Recent Labs    11/03/17 1046 12/20/17 0913  12/22/17 0359 12/23/17 0346 12/24/17 0346 12/25/17 0345  WBC 11.3* 21.9*   < > 16.9* 15.6* 10.3  --   NEUTROABS 8.7* 20.0*  --   --   --   --   --   HGB 10.3* 11.7*   < > 10.0* 10.5* 9.3*  --   HCT 30.9* 34.9*   < > 30.3* 32.5* 28.7* 26.7*    MCV  97.2 95.6   < > 96.2 96.4 96.3  --   PLT 132* 166   < > 113* 129* 112*  --    < > = values in this interval not displayed.   Cardiac Enzymes: Recent Labs    11/03/17 1046  TROPONINI <0.03   BNP: Invalid input(s): POCBNP No results found for: HGBA1C Lab Results  Component Value Date   TSH 6.861 (H) 12/20/2017   Lab Results  Component Value Date   VITAMINB12 333 12/25/2017   No results found for: FOLATE Lab Results  Component Value Date   IRON 13 (L) 12/25/2017   TIBC 192 (L) 12/25/2017   FERRITIN 58 12/25/2017    Imaging and Procedures obtained prior to SNF admission: Ct Abdomen Pelvis W Contrast  Result Date: 12/20/2017 CLINICAL DATA:  Abdominal pain, nausea vomiting since yesterday EXAM: CT ABDOMEN AND PELVIS WITH CONTRAST TECHNIQUE: Multidetector CT imaging of the abdomen and pelvis was performed using the standard protocol following bolus administration of intravenous contrast. CONTRAST:  125mL ISOVUE-300 IOPAMIDOL (ISOVUE-300) INJECTION 61% COMPARISON:  November 03 2017 FINDINGS: Lower chest: Mild atelectasis of bilateral lung bases are noted. The heart size is mildly enlarged. There is a small pericardial effusion. Hepatobiliary: No focal liver lesion is identified. Patient status post prior cholecystectomy. Intra and extrahepatic biliary ductal dilatation are identified unchanged. Pancreas: There is a 2.2 x 2.6 cm cyst in the tail of the pancreas. A second 0.8 cm cyst is identified in the tail of the pancreas likely present on the prior CT. The pancreas is otherwise unremarkable. Spleen: Normal in size without focal abnormality. Adrenals/Urinary Tract: The left adrenal gland is normal. There is a 1.5 x 1.8 cm mixed cystic and solid enhancing mass in the anterior midpole left kidney. There is no left hydronephrosis. The patient status post prior right adrenal resection. There is atrophic right kidney with significant hydronephrosis and minimal proximal hydroureter though the  majority of the right ureter is normal caliber. This is unchanged compared prior CT. There is right perinephric edema. There are 2 focus of air within the bladder lumen question recent instrumentation. Stomach/Bowel: The stomach is normal. There is no small bowel obstruction. There is mild diffuse bowel wall thickening of the left-sided transverse colon which is under distended. There is diverticulosis of colon. The appendix is not seen but no inflammation is noted surrounding the cecum. Vascular/Lymphatic: Marked aortic atherosclerosis. No enlarged abdominal or pelvic lymph nodes. Reproductive: Status post hysterectomy. No adnexal masses. Other: None. Musculoskeletal: Degenerative joint changes of the spine are noted. IMPRESSION: Diffuse bowel wall thickening of the left side transverse colon. This can be seen in colitis. No small bowel obstruction. Mixed cystic and solid enhancing mass in the midpole left kidney. Renal cell carcinoma not excluded. Further evaluation MR of kidneys is recommended. Cysts in the tail of pancreas not significantly changed compared to prior CT. Atrophic right kidney with significant right hydronephrosis and minimal proximal hydroureter not significantly changed compared prior CT. Electronically Signed   By: Abelardo Diesel M.D.   On: 12/20/2017 11:29   Dg Chest Port 1 View  Result Date: 12/21/2017 CLINICAL DATA:  Shortness of breath. EXAM: PORTABLE CHEST 1 VIEW COMPARISON:  Chest x-ray dated November 03, 2017. FINDINGS: Stable cardiomegaly. Normal pulmonary vascularity. Atherosclerotic calcification of the aortic arch. Mild bibasilar atelectasis. No focal consolidation, pleural effusion, or pneumothorax. No acute osseous abnormality. IMPRESSION: Mild bibasilar atelectasis.  No active disease. Electronically Signed   By: Orville Govern.D.  On: 12/21/2017 16:17    Assessment/Plan  1. Physical deconditioning Will have her work with physical therapy and occupational therapy team to  help with gait training and muscle strengthening exercises.fall precautions. Skin care. Encourage to be out of bed.   2. Pyelonephritis With severe right hydronephrosis. S/p right RUJ pyelogram and J stent placement. Follow with urology. Continue tylenol 650 mg q4h prn pain with norco 5-325 mg tid prn. Monitor clinically. Cbc and cmp follow up. Add zofran 4 mg q8h prn nausea.   3. Essential hypertension Elevated BP. Per pt her SBP has been in 160-180 range for last 3 years. Denies any symptom. Continue hydralazine 100 mg tid, amlodipine 10 mg daily, terazosin 5 mg daily and HCTZ 25 mg daily. Her micardis was discontinued in the hospital likely due to renal function impairment. Monitor BP. Check BMP  4. E-coli UTI Continue ciprofloxacin 500 mg bid until 01/03/18. Maintain hydration. Check cbc with diff  5. Hyperlipidemia, unspecified hyperlipidemia type Continue pravavstatin 40 mg daily.   6. CKD (chronic kidney disease) stage 3, GFR 30-59 ml/min (HCC) Monitor renal function  7. Coronary artery disease involving native coronary artery of native heart without angina pectoris Continue aspirin ec 81 mg daily, plavix 75 mg daily and pravastatin 40 mg daily.   8. psoriasis Continue apremilast 30 mg bid  9. Constipation Encouraged hydration. Continue miralax daily. Add colace 100 mg bid and prune juice and monitor  10. Anemia of chronic disease Low iron stores and ferritin. Has ckd 3. Continue iron supplement. Check cbc  11. Hypothyroidism Reviewed TSH from hospital. Check TSH in 4 weeks. Continue levothyroxine 112 mcg daily.    Family/ staff Communication: reviewed care plan with patient and charge nurse.    Labs/tests ordered: CBC and CMP 01/01/18. TSH with free t4 in 4 weeks  Blanchie Serve, MD Internal Medicine [facility] [address] [place], [place] [address] Cell Phone (Monday-Friday 8 am - 5 pm): [phone] On Call: [phone] and  follow prompts after 5 pm and on weekends Office Phone: [phone] Office Fax: [phone]

## 2018-01-01 LAB — COMPLETE METABOLIC PANEL WITH GFR
ALBUMIN: 3.2
ALK PHOS: 78
ALT: 19
AST: 22
BILIRUBIN TOTAL: 0.3
BUN: 25 — AB (ref 4–21)
CALCIUM: 8.2
Creat: 1.4
GLUCOSE: 101
Potassium: 4.9
Sodium: 132
TOTAL PROTEIN: 5.6 g/dL

## 2018-01-01 LAB — CBC
HCT: 23.2
HEMOGLOBIN: 7.8
MCV: 90.6
WBC: 7.1
platelet count: 176

## 2018-01-02 ENCOUNTER — Non-Acute Institutional Stay (SKILLED_NURSING_FACILITY): Payer: Medicare Other | Admitting: Family

## 2018-01-02 ENCOUNTER — Encounter: Payer: Self-pay | Admitting: *Deleted

## 2018-01-02 ENCOUNTER — Encounter: Payer: Self-pay | Admitting: Family

## 2018-01-02 ENCOUNTER — Telehealth: Payer: Self-pay | Admitting: *Deleted

## 2018-01-02 DIAGNOSIS — D638 Anemia in other chronic diseases classified elsewhere: Secondary | ICD-10-CM

## 2018-01-02 NOTE — Progress Notes (Signed)
Location:  [facility] Room Number: 31 Place of Service:  SNF [phone])  Provider: Marlowe Sax FNP-C   PCP: Lauraine Rinne, MD Patient Care Team: Lauraine Rinne, MD as PCP - General (Family Medicine)  Extended Emergency Contact Information Primary Emergency Contact: De Hollingshead of [place] Phone: [phone] Mobile Phone: [phone] Relation: Daughter Secondary Emergency Contact: Cena Benton of [place] Phone: [phone] Mobile Phone: [phone] Relation: Daughter Interpreter needed? No  Code Status: Full Code  Goals of care:  Advanced Directive information Advanced Directives 01/02/2018  Does Patient Have a Medical Advance Directive? No  Would patient like information on creating a medical advance directive? -     Allergies  Allergen Reactions  . Amoxicillin     Other reaction(s): Other (See Comments) Liver infection per pt report Caused liver infection   . Clonidine Anaphylaxis  . Sulfa Antibiotics Nausea Only  . Sulfamethoxazole Nausea And Vomiting  . Clonidine Derivatives   . Metoprolol     Other reaction(s): Dizziness (intolerance) syncope    Chief Complaint  Patient presents with  . Acute Visit    abnormal labs     HPI:  84 y.o. female seen today at [facility] for acute visit to evaluate abnormal lab results.she is seen in her room today.she states will be discharging back to her [place] apartment here at [facility] on Thursday.she has worked well with therapy.she is still on oral antibiotics Cipro for recent UTI until 01/03/2018.she states still has some right flank area pain but has improved since she started the antibiotics.she went to the urologist earlier prior to this visit.she states the Urology will be performing another cystoscopy very soon.also states urology suspecting cancer in the Kidney.No urology notes available for review during  visit.[place] has contacted [facility] to fax [facility] to [facility] [place].  Her Hgb 7.8,HCT 23.2,TP 5.6,Alb 3.2,CR 1.4(01/01/2018). Previous Hgb 9.3 ( 12/24/2017); 10.5 (12/23/2017).she states has been having very dark stool.she was recently started on Ferrous sulfate 325 mg tablet one by mouth twice daily during her hospital admission.she denies any headache,dizziness,or faintness.she also denies any blood in the urine or signs of bleeding anywhere.  I've discussed with patient in the presence of the facility Nurse to hold on the discharge back to the independent Living for now until we recheck the CBC again on Thursday 01/04/2018.Patient's stool guaiac positive.patient was disappointed not being able to [place] back [place] but was very understanding.she is currenly on Plavix due to previous stent placement by cardiology.she understand the high risk of bleeding with Plavix but would like to stay on it for now.     Past Medical History:  Diagnosis Date  . Adrenal cancer, right ([place])   . AKI (acute kidney injury) ([place])   . Anemia   . CKD (chronic kidney disease)   . Coronary artery disease   . Hyperlipidemia   . Hypertension   . Mesenteric ischemia ([place])   . Pancreatic cyst   . Pericardial effusion   . Peripheral polyneuropathy   . Psoriasis   . Renal insufficiency   . Restless leg syndrome   . SVT (supraventricular tachycardia) ([place])   . Thyroid disease   . Transient ischemic attack (TIA)     Past Surgical History:  Procedure Laterality Date  . ABDOMINAL HYSTERECTOMY  1975  . ADRENALECTOMY Right    with radiation  . CYSTOSCOPY WITH RETROGRADE  PYELOGRAM, URETEROSCOPY AND STENT PLACEMENT Right 12/22/2017   Procedure: CYSTOSCOPY WITH RETROGRADE PYELOGRAM, AND STENT PLACEMENT;  Surgeon: Kathie Rhodes, MD;  Location: [facility];  Service: Urology;  Laterality: Right;  . mesenteric ischemia stents    . URETERAL STENT PLACEMENT        reports that she has  never smoked. She has never used smokeless tobacco. She reports that she does not drink alcohol or use drugs. Social History   Socioeconomic History  . Marital status: Widowed    Spouse name: Not on file  . Number of children: Not on file  . Years of education: Not on file  . Highest education level: Not on file  Occupational History  . Occupation: retired  Scientific laboratory technician  . Financial resource strain: Not on file  . Food insecurity:    Worry: Not on file    Inability: Not on file  . Transportation needs:    Medical: Not on file    Non-medical: Not on file  Tobacco Use  . Smoking status: Never Smoker  . Smokeless tobacco: Never Used  Substance and Sexual Activity  . Alcohol use: Never    Frequency: Never  . Drug use: Never  . Sexual activity: Not on file  Lifestyle  . Physical activity:    Days per week: Not on file    Minutes per session: Not on file  . Stress: Not on file  Relationships  . Social connections:    Talks on phone: Not on file    Gets together: Not on file    Attends religious service: Not on file    Active member of club or organization: Not on file    Attends meetings of clubs or organizations: Not on file    Relationship status: Not on file  . Intimate partner violence:    Fear of current or ex partner: Not on file    Emotionally abused: Not on file    Physically abused: Not on file    Forced sexual activity: Not on file  Other Topics Concern  . Not on file  Social History Narrative  . Not on file    Allergies  Allergen Reactions  . Amoxicillin     Other reaction(s): Other (See Comments) Liver infection per pt report Caused liver infection   . Clonidine Anaphylaxis  . Sulfa Antibiotics Nausea Only  . Sulfamethoxazole Nausea And Vomiting  . Clonidine Derivatives   . Metoprolol     Other reaction(s): Dizziness (intolerance) syncope    Pertinent  Health Maintenance Due  Topic Date Due  . DEXA SCAN  02/10/1998  . PNA vac Low Risk Adult  (2 of 2 - PPSV23) 02/11/1999  . INFLUENZA VACCINE  12/14/2017    Medications: Outpatient Encounter Medications as of 01/02/2018  Medication Sig  . amLODipine (NORVASC) 10 MG tablet Take 1 tablet (10 mg total) by mouth daily.  Marland Kitchen Apremilast (OTEZLA PO) Take 30 mg by mouth 2 (two) times daily.   Marland Kitchen aspirin EC 81 MG tablet Take by mouth.  . ciprofloxacin (CIPRO) 500 MG tablet Take 1 tablet (500 mg total) by mouth 2 (two) times daily for 9 days.  . clopidogrel (PLAVIX) 75 MG tablet TAKE 1 TABLET BY MOUTH EVERY DAY  . docusate sodium (COLACE) 100 MG capsule Take 100 mg by mouth 2 (two) times daily.  Marland Kitchen estradiol (ESTRACE) 0.1 MG/GM vaginal cream Apply a pea-sized amount within vagina 2-3 times per week  . ferrous sulfate 325 (65 FE) MG  tablet Take 1 tablet (325 mg total) by mouth 2 (two) times daily with a meal.  . hydrALAZINE (APRESOLINE) 100 MG tablet Take 1 tablet (100 mg total) by mouth 3 (three) times daily.  . hydrochlorothiazide (HYDRODIURIL) 25 MG tablet Take 1 tablet (25 mg total) by mouth daily.  Marland Kitchen HYDROcodone-acetaminophen (NORCO/VICODIN) 5-325 MG tablet Take 1 tablet by mouth 3 (three) times daily as needed for moderate pain or severe pain.  Marland Kitchen ketoconazole (NIZORAL) 2 % shampoo Apply 1 application topically 3 (three) times a week. Use three times weekly for two weeks.  Marland Kitchen levothyroxine (SYNTHROID, LEVOTHROID) 112 MCG tablet TAKE 1 TABLET (112 MCG TOTAL) BY MOUTH DAILY AT 6AM  . Melatonin 3 MG TABS Take 1 tablet by mouth at bedtime.  . polyethylene glycol (MIRALAX / GLYCOLAX) packet Take 17 g by mouth daily.  . pravastatin (PRAVACHOL) 40 MG tablet TAKE 1 TABLET BY MOUTH EVERY DAY AT NIGHT  . saccharomyces boulardii (FLORASTOR) 250 MG capsule Take 250 mg by mouth 2 (two) times daily. Stop date 01/04/18  . terazosin (HYTRIN) 5 MG capsule Take 1 capsule (5 mg total) by mouth at bedtime.  . [DISCONTINUED] acetaminophen (TYLENOL) 325 MG tablet Take 650 mg by mouth every 4 (four) hours as needed.  Stop date 12/27/17   No facility-administered encounter medications on file as of 01/02/2018.      Review of Systems  Constitutional: Negative for appetite change, chills, fatigue and fever.  HENT: Negative for nosebleeds.   Respiratory: Negative for cough, chest tightness, shortness of breath and wheezing.   Cardiovascular: Negative for chest pain, palpitations and leg swelling.  Gastrointestinal: Negative for abdominal distention, abdominal pain, constipation, diarrhea, nausea, rectal pain and vomiting.       Has had dark black stool   Genitourinary: Negative for dysuria, frequency, hematuria and urgency.  Skin: Negative for color change, pallor and rash.  Neurological: Negative for dizziness, light-headedness and headaches.  Hematological: Does not bruise/bleed easily.  Psychiatric/Behavioral: Negative for agitation, confusion and sleep disturbance. The patient is not nervous/anxious.     Vitals:   01/02/18 1107  BP: (!) 151/61  Pulse: 94  Resp: 20  Temp: 98.2 F (36.8 C)  SpO2: 95%  Weight: 125 lb (56.7 kg)  Height: 5\' 4"  (1.626 m)   Body mass index is 21.46 kg/m. Physical Exam  Constitutional: She is oriented to person, place, and time. She appears well-developed.  Elderly in no acute distress   HENT:  Head: Normocephalic.  Right Ear: External ear normal.  Left Ear: External ear normal.  Mouth/Throat: Oropharynx is clear and moist. No oropharyngeal exudate.  Eyes: Pupils are equal, round, and reactive to light. Conjunctivae and EOM are normal. Right eye exhibits no discharge. Left eye exhibits no discharge. No scleral icterus.  Neck: Normal range of motion. No JVD present. No thyromegaly present.  Cardiovascular: Exam reveals no gallop and no friction rub.  Murmur heard. Respiratory: Effort normal and breath sounds normal. No respiratory distress. She has no wheezes. She has no rales.  GI: Soft. Bowel sounds are normal. She exhibits no distension and no mass. There is  no tenderness. There is no rebound and no guarding.  Genitourinary: Rectal exam shows guaiac positive stool.  Genitourinary Comments: Rectal exam negative for hemorrhoids   Musculoskeletal: Normal range of motion. She exhibits no edema or tenderness.  Lymphadenopathy:    She has no cervical adenopathy.  Neurological: She is oriented to person, place, and time.  Skin: Skin is warm and  dry. No rash noted. No erythema. No pallor.  Psychiatric: She has a normal mood and affect. Her behavior is normal. Judgment and thought content normal.     Labs reviewed: Basic Metabolic Panel: Recent Labs    12/22/17 0359 12/23/17 0346 12/25/17 0345 01/01/18  NA 135 135 135 132  K 4.6 4.3 4.4 4.9  CL 106 104 103  --   CO2 24 23 25   --   GLUCOSE 112* 110* 120*  --   BUN 24* 33* 37* 25*  CREATININE 1.28* 1.18* 1.10* 1.40  CALCIUM 8.4* 8.3* 8.2* 8.2  MG  --  1.9  --   --    Liver Function Tests: Recent Labs    12/22/17 0359 12/25/17 0345 01/01/18  AST 19 14* 22  ALT 14 11 19   ALKPHOS 60 48 78  BILITOT 0.7 0.5 0.3  PROT 5.8* 5.6* 5.6  ALBUMIN 2.8* 2.5* 3.2   Recent Labs    11/03/17 1046 12/20/17 0913  LIPASE 21 22   CBC: Recent Labs    11/03/17 1046 12/20/17 0913  12/22/17 0359 12/23/17 0346 12/24/17 0346 12/25/17 0345 01/01/18  WBC 11.3* 21.9*   < > 16.9* 15.6* 10.3  --  7.1  NEUTROABS 8.7* 20.0*  --   --   --   --   --   --   HGB 10.3* 11.7*   < > 10.0* 10.5* 9.3*  --  7.8  HCT 30.9* 34.9*   < > 30.3* 32.5* 28.7* 26.7* 23.2  MCV 97.2 95.6   < > 96.2 96.4 96.3  --  90.6  PLT 132* 166   < > 113* 129* 112*  --   --    < > = values in this interval not displayed.   Cardiac Enzymes: Recent Labs    11/03/17 1046  TROPONINI <0.03    Procedures and Imaging Studies During Stay: Ct Abdomen Pelvis W Contrast  Result Date: 12/20/2017 CLINICAL DATA:  Abdominal pain, nausea vomiting since yesterday EXAM: CT ABDOMEN AND PELVIS WITH CONTRAST TECHNIQUE: Multidetector CT imaging of  the abdomen and pelvis was performed using the standard protocol following bolus administration of intravenous contrast. CONTRAST:  166mL ISOVUE-300 IOPAMIDOL (ISOVUE-300) INJECTION 61% COMPARISON:  November 03 2017 FINDINGS: Lower chest: Mild atelectasis of bilateral lung bases are noted. The heart size is mildly enlarged. There is a small pericardial effusion. Hepatobiliary: No focal liver lesion is identified. Patient status post prior cholecystectomy. Intra and extrahepatic biliary ductal dilatation are identified unchanged. Pancreas: There is a 2.2 x 2.6 cm cyst in the tail of the pancreas. A second 0.8 cm cyst is identified in the tail of the pancreas likely present on the prior CT. The pancreas is otherwise unremarkable. Spleen: Normal in size without focal abnormality. Adrenals/Urinary Tract: The left adrenal gland is normal. There is a 1.5 x 1.8 cm mixed cystic and solid enhancing mass in the anterior midpole left kidney. There is no left hydronephrosis. The patient status post prior right adrenal resection. There is atrophic right kidney with significant hydronephrosis and minimal proximal hydroureter though the majority of the right ureter is normal caliber. This is unchanged compared prior CT. There is right perinephric edema. There are 2 focus of air within the bladder lumen question recent instrumentation. Stomach/Bowel: The stomach is normal. There is no small bowel obstruction. There is mild diffuse bowel wall thickening of the left-sided transverse colon which is under distended. There is diverticulosis of colon. The appendix is not seen but  no inflammation is noted surrounding the cecum. Vascular/Lymphatic: Marked aortic atherosclerosis. No enlarged abdominal or pelvic lymph nodes. Reproductive: Status post hysterectomy. No adnexal masses. Other: None. Musculoskeletal: Degenerative joint changes of the spine are noted. IMPRESSION: Diffuse bowel wall thickening of the left side transverse colon. This  can be seen in colitis. No small bowel obstruction. Mixed cystic and solid enhancing mass in the midpole left kidney. Renal cell carcinoma not excluded. Further evaluation MR of kidneys is recommended. Cysts in the tail of pancreas not significantly changed compared to prior CT. Atrophic right kidney with significant right hydronephrosis and minimal proximal hydroureter not significantly changed compared prior CT. Electronically Signed   By: Abelardo Diesel M.D.   On: 12/20/2017 11:29   Dg Chest Port 1 View  Result Date: 12/21/2017 CLINICAL DATA:  Shortness of breath. EXAM: PORTABLE CHEST 1 VIEW COMPARISON:  Chest x-ray dated November 03, 2017. FINDINGS: Stable cardiomegaly. Normal pulmonary vascularity. Atherosclerotic calcification of the aortic arch. Mild bibasilar atelectasis. No focal consolidation, pleural effusion, or pneumothorax. No acute osseous abnormality. IMPRESSION: Mild bibasilar atelectasis.  No active disease. Electronically Signed   By: Titus Dubin M.D.   On: 12/21/2017 16:17   Dg C-arm 1-60 Min-no Report  Result Date: 12/22/2017 Fluoroscopy was utilized by the requesting physician.  No radiographic interpretation.    Assessment/Plan:    Anemia of chronic disease Hgb 7.8(01/01/2018) previous 9.3.Asymptomatic.No obvious signs of bleeding.Stool guaiac positive.I've discussed patient's Hgb results with Dr.Pandey who recommends holding on [place]'s discharge back to [place]. - check CBC,CMP on 01/04/2018. - start Protonix 40 mg tablet one by mouth twice daily  - check Orthostatic B/P and HR daily x 3 days and provide readings for provider to evaluate. - Monitor for signs of bleeding.  Future labs/tests needed:  CBC, CMP 01/04/2018

## 2018-01-02 NOTE — Telephone Encounter (Signed)
Requested records from [facility] appointment today. Asked they be faxed to [facility]. Spoke with [place].

## 2018-01-04 LAB — COMPLETE METABOLIC PANEL WITH GFR
ALBUMIN: 3.4
ALT: 13
AST: 17
Alkaline Phosphatase: 78
BUN: 15 (ref 4–21)
CALCIUM: 8.4
Creat: 1.27
GFR CALC NON AF AMER: 39
GLUCOSE: 104
POTASSIUM: 4.4
SODIUM: 134
Total Bilirubin: 0.3
Total Protein: 6.1 g/dL

## 2018-01-04 LAB — CBC
HCT: 29.4
HGB: 9.6
MCV: 93.3
PLATELET COUNT: 247
WBC: 7.9

## 2018-01-05 ENCOUNTER — Encounter: Payer: Self-pay | Admitting: *Deleted

## 2018-01-08 ENCOUNTER — Non-Acute Institutional Stay (SKILLED_NURSING_FACILITY): Payer: Medicare Other | Admitting: Family

## 2018-01-08 ENCOUNTER — Encounter: Payer: Self-pay | Admitting: Family

## 2018-01-08 DIAGNOSIS — D638 Anemia in other chronic diseases classified elsewhere: Secondary | ICD-10-CM | POA: Diagnosis not present

## 2018-01-08 DIAGNOSIS — I2510 Atherosclerotic heart disease of native coronary artery without angina pectoris: Secondary | ICD-10-CM

## 2018-01-08 DIAGNOSIS — E785 Hyperlipidemia, unspecified: Secondary | ICD-10-CM

## 2018-01-08 DIAGNOSIS — E039 Hypothyroidism, unspecified: Secondary | ICD-10-CM | POA: Diagnosis not present

## 2018-01-08 DIAGNOSIS — I10 Essential (primary) hypertension: Secondary | ICD-10-CM

## 2018-01-08 DIAGNOSIS — L409 Psoriasis, unspecified: Secondary | ICD-10-CM

## 2018-01-08 DIAGNOSIS — K5900 Constipation, unspecified: Secondary | ICD-10-CM

## 2018-01-08 DIAGNOSIS — G4700 Insomnia, unspecified: Secondary | ICD-10-CM

## 2018-01-09 ENCOUNTER — Other Ambulatory Visit: Payer: Self-pay | Admitting: Urology

## 2018-01-09 NOTE — Progress Notes (Signed)
Location:  [place] Room Number: 31 Place of Service:  SNF [phone])  Provider: Marlowe Sax FNP-C   PCP: Lauraine Rinne, MD Patient Care Team: Lauraine Rinne, MD as PCP - General (Family Medicine)  Extended Emergency Contact Information Primary Emergency Contact: De Hollingshead of [place] Phone: [phone] Mobile Phone: [phone] Relation: Daughter Secondary Emergency Contact: Cena Benton of [place] Phone: [phone] Mobile Phone: [phone] Relation: Daughter Interpreter needed? No  Code Status: Full Code  Goals of care:  Advanced Directive information Advanced Directives 01/08/2018  Does Patient Have a Medical Advance Directive? No  Would patient like information on creating a medical advance directive? -     Allergies  Allergen Reactions  . Amoxicillin     Other reaction(s): Other (See Comments) Liver infection per pt report Caused liver infection   . Clonidine Anaphylaxis  . Sulfa Antibiotics Nausea Only  . Sulfamethoxazole Nausea And Vomiting  . Clonidine Derivatives   . Metoprolol     Other reaction(s): Dizziness (intolerance) syncope    Chief Complaint  Patient presents with  . Discharge Note    HPI:  84 y.o. female seen today at [facility] for discharge back to independent Living at [facility].She was here for short term rehabilitation for post hospital admission from 12/20/2017 - 12/25/2017 for severe right flank pain.Her CT scan showed severe right hydronephrosis with pyelonephritis.she was seen by Urology 12/22/2017 and underwent endoscopy with right retrograde pyelogram/ right double- J stent placement. Urine culture was positive for > 100,000 colonies of E.coli.Her blood cultures were negative.she was treated with 9 days course of oral antibiotics Cipro 500 mg tablet twice daily with much improvement. Her admission was also complicated by acute hypoxia  respiratory Failure secondary to pulmonary edema,hypertensive emergency,malignant hypertension on three antihypertensive medications and intermittent diuretic.oxygen supplement as needed to keep O2 saturations above 90%.She was given I.V lasix 40 mg.CXR showed mild increase in pulmonary vascularity with suspicion for mild pulmonary edema.Also had pericardial effusion.Recommended follow up with outpatient cardiology.  She was also started on ferrous sulfate 325 mg tablet twice daily for Hgb 9.3.Thrombocytopenia Plts 112 she had no signs of bleeding.  CT scan showed left renal mass though not new per patient previously diagnosed at [facility] follows up with outpatient Oncologist.Her pancreatic cysts showed no significant changes compared to previous CT scan.Also follows up with oncology for Right adrenal carcinoma.she has a medical history of HTN,CAD,TIA,Hyperlipidemia,right adrenal cancer,mesenteric ischemia,CKD stage 3,psoriais among other conditions.  During her stay here in [place] her Hgb was 7.8,HCT 23.2 (01/01/2018). Her stool Guaiaci positive.she was started on Protonix 40 mg tablet twice daily with much improvement.Lastes now Hgb 9.6 at discharge.No signs of bleeding reported.she does states still has dark black stool which could be from her ferrous sulfate.she is currenly on Plavix due to previous stent placement by cardiology.she understand the high risk of bleeding with Plavix and ASA but would like to stay on it for now.She denies any headache,dizziness or fatigue. She was seen [facility] on 01/02/2018 plan for a right cystoscopy with retrograde pyelogram and stent placement.      She has worked well with PT/OT now stable for discharge back [place] at [facility] She will be discharged with legacy  PT/OT to continue with ROM, Exercise, Gait stability and muscle strengthening.She does not require any DME.she will be discharged with her Prescription medication from the facility.she states does not need any  Norco for pain.Will discontinue Norco.she is aware to follow up with PCP in 1-2 weeks.She will make appointment with Her PCP DR.[name]  upon discharge.She denies any acute issues this visit.Patient son states no concerns.Facility staff report no new concerns.   Past Medical History:  Diagnosis Date  . Adrenal cancer, right ([place])   . AKI (acute kidney injury) ([place])   . Anemia   . CKD (chronic kidney disease)   . Coronary artery disease   . Hyperlipidemia   . Hypertension   . Mesenteric ischemia ([place])   . Pancreatic cyst   . Pericardial effusion   . Peripheral polyneuropathy   . Psoriasis   . Renal insufficiency   . Restless leg syndrome   . SVT (supraventricular tachycardia) ([place])   . Thyroid disease   . Transient ischemic attack (TIA)     Past Surgical History:  Procedure Laterality Date  . ABDOMINAL HYSTERECTOMY  1975  . ADRENALECTOMY Right    with radiation  . CYSTOSCOPY WITH RETROGRADE PYELOGRAM, URETEROSCOPY AND STENT PLACEMENT Right 12/22/2017   Procedure: CYSTOSCOPY WITH RETROGRADE PYELOGRAM, AND STENT PLACEMENT;  Surgeon: Kathie Rhodes, MD;  Location: [facility];  Service: Urology;  Laterality: Right;  . mesenteric ischemia stents    . URETERAL STENT PLACEMENT        reports that she has never smoked. She has never used smokeless tobacco. She reports that she does not drink alcohol or use drugs. Social History   Socioeconomic History  . Marital status: Widowed    Spouse name: Not on file  . Number of children: Not on file  . Years of education: Not on file  . Highest education level: Not on file  Occupational History  . Occupation: retired  Scientific laboratory technician  . Financial resource strain: Not on file  . Food insecurity:    Worry: Not on file    Inability: Not on file  . Transportation needs:    Medical: Not on file    Non-medical: Not on file  Tobacco Use  . Smoking status: Never Smoker  . Smokeless tobacco: Never Used  Substance and Sexual Activity  .  Alcohol use: Never    Frequency: Never  . Drug use: Never  . Sexual activity: Not on file  Lifestyle  . Physical activity:    Days per week: Not on file    Minutes per session: Not on file  . Stress: Not on file  Relationships  . Social connections:    Talks on phone: Not on file    Gets together: Not on file    Attends religious service: Not on file    Active member of club or organization: Not on file    Attends meetings of clubs or organizations: Not on file    Relationship status: Not on file  . Intimate partner violence:    Fear of current or ex partner: Not on file    Emotionally abused: Not on file    Physically abused: Not on file    Forced sexual activity: Not on file  Other Topics Concern  . Not on file  Social History Narrative  . Not on file   Functional Status Survey: Is the patient deaf or have difficulty hearing?: No Does the patient have difficulty seeing, even when wearing glasses/contacts?: No Does the patient have difficulty concentrating, remembering, or making decisions?: No Does the patient have difficulty walking or climbing stairs?: No Does the patient have difficulty dressing or bathing?: No Does the patient  have difficulty doing errands alone such as visiting a doctor's office or shopping?: Yes  Allergies  Allergen Reactions  . Amoxicillin     Other reaction(s): Other (See Comments) Liver infection per pt report Caused liver infection   . Clonidine Anaphylaxis  . Sulfa Antibiotics Nausea Only  . Sulfamethoxazole Nausea And Vomiting  . Clonidine Derivatives   . Metoprolol     Other reaction(s): Dizziness (intolerance) syncope    Pertinent  Health Maintenance Due  Topic Date Due  . DEXA SCAN  02/10/1998  . PNA vac Low Risk Adult (2 of 2 - PPSV23) 02/11/1999  . INFLUENZA VACCINE  12/14/2017    Medications: Outpatient Encounter Medications as of 01/08/2018  Medication Sig  . amLODipine (NORVASC) 10 MG tablet Take 1 tablet (10 mg total)  by mouth daily.  Marland Kitchen Apremilast (OTEZLA PO) Take 30 mg by mouth 2 (two) times daily.   Marland Kitchen aspirin EC 81 MG tablet Take by mouth.  . clopidogrel (PLAVIX) 75 MG tablet TAKE 1 TABLET BY MOUTH EVERY DAY  . docusate sodium (COLACE) 100 MG capsule Take 100 mg by mouth 2 (two) times daily.  Marland Kitchen estradiol (ESTRACE) 0.1 MG/GM vaginal cream Apply a pea-sized amount within vagina 2-3 times per week  . ferrous sulfate 325 (65 FE) MG tablet Take 1 tablet (325 mg total) by mouth 2 (two) times daily with a meal.  . hydrALAZINE (APRESOLINE) 100 MG tablet Take 1 tablet (100 mg total) by mouth 3 (three) times daily.  . hydrochlorothiazide (HYDRODIURIL) 25 MG tablet Take 1 tablet (25 mg total) by mouth daily.  Marland Kitchen ketoconazole (NIZORAL) 2 % shampoo Apply 1 application topically 3 (three) times a week. Use three times weekly for two weeks.  Marland Kitchen levothyroxine (SYNTHROID, LEVOTHROID) 112 MCG tablet TAKE 1 TABLET (112 MCG TOTAL) BY MOUTH DAILY AT 6AM  . Melatonin 3 MG TABS Take 1 tablet by mouth at bedtime.  . pantoprazole (PROTONIX) 40 MG tablet Take 40 mg by mouth 2 (two) times daily.  . polyethylene glycol (MIRALAX / GLYCOLAX) packet Take 17 g by mouth daily.  . pravastatin (PRAVACHOL) 40 MG tablet TAKE 1 TABLET BY MOUTH EVERY DAY AT NIGHT  . terazosin (HYTRIN) 5 MG capsule Take 1 capsule (5 mg total) by mouth at bedtime.  . [DISCONTINUED] HYDROcodone-acetaminophen (NORCO/VICODIN) 5-325 MG tablet Take 1 tablet by mouth 3 (three) times daily as needed for moderate pain or severe pain.  . [DISCONTINUED] saccharomyces boulardii (FLORASTOR) 250 MG capsule Take 250 mg by mouth 2 (two) times daily. Stop date 01/04/18   No facility-administered encounter medications on file as of 01/08/2018.      Review of Systems  Constitutional: Negative for chills, fatigue, fever and unexpected weight change.  HENT: Negative for congestion, nosebleeds, rhinorrhea, sinus pressure, sinus pain, sneezing, sore throat and trouble swallowing.     Eyes: Negative for discharge, redness and itching.  Respiratory: Negative for cough, chest tightness, shortness of breath and wheezing.   Cardiovascular: Negative for chest pain, palpitations and leg swelling.  Gastrointestinal: Negative for abdominal distention, abdominal pain, blood in stool, constipation, diarrhea and vomiting.       Occasional nausea though has improved.Black stool properly from iron supplement.   Endocrine: Negative for cold intolerance, heat intolerance, polydipsia, polyphagia and polyuria.  Genitourinary: Negative for dysuria, frequency, hematuria and urgency.       Right Flank pain has improved   Musculoskeletal: Negative for arthralgias and gait problem.  Skin: Negative for color change, pallor, rash and  wound.  Neurological: Negative for dizziness, syncope, light-headedness and headaches.  Hematological: Does not bruise/bleed easily.  Psychiatric/Behavioral: Negative for agitation, behavioral problems, confusion and sleep disturbance. The patient is not nervous/anxious.     Vitals:   01/08/18 1218  BP: (!) 172/60  Pulse: 82  Resp: 20  Temp: 97.7 F (36.5 C)  SpO2: 96%  Weight: 125 lb 6.4 oz (56.9 kg)  Height: 5\' 4"  (1.626 m)   Body mass index is 21.52 kg/m. Physical Exam  Constitutional: She is oriented to person, place, and time.  Thin built,elderly in no acute distress   HENT:  Head: Normocephalic.  Right Ear: External ear normal.  Mouth/Throat: Oropharynx is clear and moist. No oropharyngeal exudate.  Left ear cerumen impaction TM not visualized.   Eyes: Pupils are equal, round, and reactive to light. Conjunctivae and EOM are normal. Right eye exhibits no discharge. Left eye exhibits no discharge. No scleral icterus.  Neck: Normal range of motion. No JVD present. No thyromegaly present.  Cardiovascular: Normal rate, regular rhythm, normal heart sounds and intact distal pulses. Exam reveals no gallop and no friction rub.  No murmur  heard. Pulmonary/Chest: Effort normal and breath sounds normal. No respiratory distress. She has no wheezes. She has no rales.  Abdominal: Soft. Bowel sounds are normal. She exhibits no distension and no mass. There is no tenderness. There is no rebound and no guarding.  Musculoskeletal: Normal range of motion. She exhibits no edema, tenderness or deformity.  Lymphadenopathy:    She has no cervical adenopathy.  Neurological: She is oriented to person, place, and time.  Skin: Skin is warm and dry. No rash noted. No erythema. No pallor.  Psychiatric: Mood, affect and judgment normal.  Nursing note and vitals reviewed.    Labs reviewed: Basic Metabolic Panel: Recent Labs    12/22/17 0359 12/23/17 0346 12/25/17 0345 01/01/18 01/04/18  NA 135 135 135 132 134  K 4.6 4.3 4.4 4.9 4.4  CL 106 104 103  --   --   CO2 24 23 25   --   --   GLUCOSE 112* 110* 120*  --   --   BUN 24* 33* 37* 25* 15  CREATININE 1.28* 1.18* 1.10* 1.40 1.27  CALCIUM 8.4* 8.3* 8.2* 8.2 8.4  MG  --  1.9  --   --   --    Liver Function Tests: Recent Labs    12/25/17 0345 01/01/18 01/04/18  AST 14* 22 17  ALT 11 19 13   ALKPHOS 48 78 78  BILITOT 0.5 0.3 0.3  PROT 5.6* 5.6 6.1  ALBUMIN 2.5* 3.2 3.4   Recent Labs    11/03/17 1046 12/20/17 0913  LIPASE 21 22   CBC: Recent Labs    11/03/17 1046 12/20/17 0913  12/22/17 0359 12/23/17 0346 12/24/17 0346 12/25/17 0345 01/01/18 01/04/18  WBC 11.3* 21.9*   < > 16.9* 15.6* 10.3  --  7.1 7.9  NEUTROABS 8.7* 20.0*  --   --   --   --   --   --   --   HGB 10.3* 11.7*   < > 10.0* 10.5* 9.3*  --  7.8 9.6  HCT 30.9* 34.9*   < > 30.3* 32.5* 28.7* 26.7* 23.2 29.4  MCV 97.2 95.6   < > 96.2 96.4 96.3  --  90.6 93.3  PLT 132* 166   < > 113* 129* 112*  --   --   --    < > = values  in this interval not displayed.   Cardiac Enzymes: Recent Labs    11/03/17 1046  TROPONINI <0.03    Procedures and Imaging Studies During Stay: Ct Abdomen Pelvis W Contrast  Result  Date: 12/20/2017 CLINICAL DATA:  Abdominal pain, nausea vomiting since yesterday EXAM: CT ABDOMEN AND PELVIS WITH CONTRAST TECHNIQUE: Multidetector CT imaging of the abdomen and pelvis was performed using the standard protocol following bolus administration of intravenous contrast. CONTRAST:  147mL ISOVUE-300 IOPAMIDOL (ISOVUE-300) INJECTION 61% COMPARISON:  November 03 2017 FINDINGS: Lower chest: Mild atelectasis of bilateral lung bases are noted. The heart size is mildly enlarged. There is a small pericardial effusion. Hepatobiliary: No focal liver lesion is identified. Patient status post prior cholecystectomy. Intra and extrahepatic biliary ductal dilatation are identified unchanged. Pancreas: There is a 2.2 x 2.6 cm cyst in the tail of the pancreas. A second 0.8 cm cyst is identified in the tail of the pancreas likely present on the prior CT. The pancreas is otherwise unremarkable. Spleen: Normal in size without focal abnormality. Adrenals/Urinary Tract: The left adrenal gland is normal. There is a 1.5 x 1.8 cm mixed cystic and solid enhancing mass in the anterior midpole left kidney. There is no left hydronephrosis. The patient status post prior right adrenal resection. There is atrophic right kidney with significant hydronephrosis and minimal proximal hydroureter though the majority of the right ureter is normal caliber. This is unchanged compared prior CT. There is right perinephric edema. There are 2 focus of air within the bladder lumen question recent instrumentation. Stomach/Bowel: The stomach is normal. There is no small bowel obstruction. There is mild diffuse bowel wall thickening of the left-sided transverse colon which is under distended. There is diverticulosis of colon. The appendix is not seen but no inflammation is noted surrounding the cecum. Vascular/Lymphatic: Marked aortic atherosclerosis. No enlarged abdominal or pelvic lymph nodes. Reproductive: Status post hysterectomy. No adnexal masses.  Other: None. Musculoskeletal: Degenerative joint changes of the spine are noted. IMPRESSION: Diffuse bowel wall thickening of the left side transverse colon. This can be seen in colitis. No small bowel obstruction. Mixed cystic and solid enhancing mass in the midpole left kidney. Renal cell carcinoma not excluded. Further evaluation MR of kidneys is recommended. Cysts in the tail of pancreas not significantly changed compared to prior CT. Atrophic right kidney with significant right hydronephrosis and minimal proximal hydroureter not significantly changed compared prior CT. Electronically Signed   By: Abelardo Diesel M.D.   On: 12/20/2017 11:29   Dg Chest Port 1 View  Result Date: 12/21/2017 CLINICAL DATA:  Shortness of breath. EXAM: PORTABLE CHEST 1 VIEW COMPARISON:  Chest x-ray dated November 03, 2017. FINDINGS: Stable cardiomegaly. Normal pulmonary vascularity. Atherosclerotic calcification of the aortic arch. Mild bibasilar atelectasis. No focal consolidation, pleural effusion, or pneumothorax. No acute osseous abnormality. IMPRESSION: Mild bibasilar atelectasis.  No active disease. Electronically Signed   By: Titus Dubin M.D.   On: 12/21/2017 16:17   Dg C-arm 1-60 Min-no Report  Result Date: 12/22/2017 Fluoroscopy was utilized by the requesting physician.  No radiographic interpretation.    Assessment/Plan:   1. Generalized weakness    Has improved with Therapy.Will discharge with legacy PT/ OT  to continue with ROM, Exercise, Gait stability and muscle strengthening. No DME required.  2. Essential hypertension B/p reading reviewed SBP ranging in the 150's-170's though has improved.continue on Amlodipine 10 mg tablet daily,terazosin 5 mg tablet daily,HCZT 25 mg tablet daily and Hydralazine 100 mg tablet three times daily.Recheck BMP  in 1-2 weeks with PCP.    2. Hyperlipidemia Continue on Pravastatin 40 mg daily. lipid panel deferred to PCP.    3. Anemia of chronic disease Hgb was 7.8 with  positive guaiac stool.patient on ASA 81 mg tablet daily,plavix 75 mg tablet daily but wishes to continue on this medication. Protonix 40 mg tablet twice daily initiated.Hgb now 9.6 on discharge.Has Hx CKD stage 3.continue on Ferrous sulfate 325 mg tablet twice daily.CBC in 1-2 weeks with PCP   4. Acquired hypothyroidism Continue on levothyroxine 112 mcg tablet daily.TSH level with PCP.    5. Constipation, unspecified constipation type Current regimen effective.continue to encourage oral intake and hydration.   6. Coronary artery disease involving native coronary artery of native heart without angina pectoris No chest pain.continue on ASA 81 mg tablet daily,plavix 75 mg tablet daily and Pravastatin 40 mg tablet daily.  7. Insomnia Melatonin 3 mg tablet at bedtime effective.   8. Psoriasis Continue on Otezla 30 mg tablet twice daily.  Patient is being discharged with the following home health services:   -PT/OT for ROM, exercise, gait stability and muscle strengthening   Patient is being discharged with the following durable medical equipment:   - None   Patient has been advised to f/u with their PCP in 1-2 weeks to for a transitions of care visit.Social services at their facility was responsible for arranging this appointment.Pt was discharge from the facility with adequate prescriptions of noncontrolled medications to reach the scheduled appointment.Her Norco was discontinued per patient's request and none in use.  Future labs/tests needed:  TSH level, CBC, BMP in 1-2 weeks PCP

## 2018-01-16 ENCOUNTER — Encounter (HOSPITAL_BASED_OUTPATIENT_CLINIC_OR_DEPARTMENT_OTHER): Payer: Self-pay

## 2018-01-17 ENCOUNTER — Other Ambulatory Visit: Payer: Self-pay

## 2018-01-17 ENCOUNTER — Encounter (HOSPITAL_BASED_OUTPATIENT_CLINIC_OR_DEPARTMENT_OTHER): Payer: Self-pay

## 2018-01-17 NOTE — Progress Notes (Signed)
Spoke with:  Inez Catalina NPO:  After Midnight, no gum, candy, or mints  Arrival time: 0530AM Labs:  (EKG 12/20/2017, CBC, CMP 01/04/2018 in epic) AM medications:  Amlodipine, Hydralazine, Levothyroxine, Pravastatin, Terazosin Pre op orders: Yes Ride home:  Leata Mouse (daughter in law ) [phone]

## 2018-01-19 NOTE — H&P (Signed)
HPI: Brooke Walker is a 84 year-old female with right ureteral obstruction.  The obstruction is on the right side. Her problem was discovered 12/21/2017. She had the following imaging studies done: CT Scan. She does have a stent in place. Her stent was placed 12/22/2017 .   She does have a history of urinary infections. Her renal function is abnormal.   01/02/18: She was seen in the hospital on 12/21/17 for right hydronephrosis and was found to have pyonephrosis. A stent was placed and the culture grew pan sensitive E. coli. At the time of stent placement I found what appeared to be patent and ureteral stricture but it appeared that her hydronephrosis was not present about 6 months earlier. My concern was that of possible urothelial malignancy but was unable to perform ureteroscopy due to the presence of infection at the time of her stent placement.  She reports she has been doing well. She remains on Cipro and is having some mild nausea likely from the medication. She also says she has a full feeling in her flank that she did have before that I think is probably from the stent.      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 She was noted on imaging studies to have a markedly atrophic right kidney.   Left renal lesion: She was found to have a small lesion within the left kidney in 2018. It was felt to possibly represent a neoplasm and has been followed by her oncologist, Dr. Gwynneth Macleod, at [facility] who recommended observation only due to the fact that she has essentially only a single renal unit.   History of recurrent UTIs: She has a history of recurrent UTIs.     NON-GU PMH: No Non-GU PMH    FAMILY HISTORY:  No Family History    SOCIAL HISTORY: Marital Status: Widowed Preferred Language: English; Ethnicity: Not Hispanic Or Latino; Race: White Current Smoking Status: Patient has never smoked.   Tobacco Use Assessment Completed: Used Tobacco in last 30 days? Has never drank.  Drinks 1 caffeinated drink per day.    REVIEW OF SYSTEMS:    GU Review Female:   Patient denies frequent urination, hard to postpone urination, burning /pain with urination, get up at night to urinate, leakage of urine, stream starts and stops, trouble starting your stream, have to strain to urinate, and being pregnant.  Gastrointestinal (Upper):   Patient denies nausea, vomiting, and indigestion/ heartburn.  Gastrointestinal (Lower):   Patient denies diarrhea and constipation.  Constitutional:   Patient denies fever, night sweats, weight loss, and fatigue.  Skin:   Patient denies skin rash/ lesion and itching.  Eyes:   Patient denies blurred vision and double vision.  Ears/ Nose/ Throat:   Patient denies sore throat and sinus problems.  Hematologic/Lymphatic:   Patient denies swollen glands and easy bruising.  Cardiovascular:   Patient denies leg swelling and chest pains.  Respiratory:   Patient denies cough and shortness of breath.  Endocrine:   Patient denies excessive thirst.  Musculoskeletal:   Patient denies back pain and joint pain.  Neurological:   Patient denies headaches and dizziness.  Psychologic:  Patient denies depression and anxiety.   VITAL SIGNS:    Weight 124 lb / 56.25 kg  Height 64.5 in / 163.83 cm  BP 181/58 mmHg  Pulse 93 /min  BMI 21.0 kg/m   General appearance: alert and appears stated age Head: Normocephalic, without obvious abnormality, atraumatic Eyes: conjunctivae/corneas clear. EOM's intact.  Oropharynx: moist mucous membranes Neck: supple, symmetrical, trachea midline Resp: normal respiratory effort Cardio: regular rate and rhythm Back: symmetric, no curvature. ROM normal.   Minimal CVA tenderness. GI: soft, bowel sounds normal; no masses,  no organomegaly Extremities: extremities normal, atraumatic, no cyanosis or edema Skin: Skin color normal. No visible rashes or lesions Neurologic: Grossly normal  PAST DATA REVIEWED:  Source Of History:  Patient, Outside Source  Lab Test Review:   BUN/Creatinine, Hemoglobin and Hematocrit  Records Review:   Previous Hospital Records  Notes:                     We she had a creatinine done on 01/01/18 that was 1.4. Her H&H was 7.8 and 23.2.   PROCEDURES:          Urinalysis w/Scope Dipstick Dipstick Cont'd Micro  Color: Yellow Bilirubin: Neg mg/dL WBC/hpf: 6 - 10/hpf  Appearance: Clear Ketones: Neg mg/dL RBC/hpf: NS (Not Seen)  Specific Gravity: 1.025 Blood: Neg ery/uL Bacteria: Many (>50/hpf)  pH: <=5.0 Protein: 1+ mg/dL Cystals: NS (Not Seen)  Glucose: Neg mg/dL Urobilinogen: 0.2 mg/dL Casts: NS (Not Seen)    Nitrites: Neg Trichomonas: Not Present    Leukocyte Esterase: Neg leu/uL Mucous: Not Present      Epithelial Cells: 0 - 5/hpf      Yeast: NS (Not Seen)      Sperm: Not Present    ASSESSMENT/PLAN:     ICD-10 Details  1 GU:   Ureteral obstruction, right  I discussed with her while her daughter was listening on the phone about the fact that she had significant pyonephrosis but also have what appeared to be obstruction of her right ureter. Since this was new obstruction it raise the question as to whether it was inflammatory due to the presence of significant infection and low urine output from a atrophic kidney which I told her I did not see very often but the other possibility was that it could be due to urothelial malignancy and now that her infection has been adequately treated I told her I needed to evaluate this further with a retrograde pyelogram in order to determine whether the ureter was now patent and need to evaluate it ureteroscopically with the possible need for biopsy. I told her that I would also  potentially leave a stent if she continued to remain obstructed until the biopsy returned but could use a different stent that may be slightly better tolerated.    She is going to be scheduled for cystoscopy with removal of her right ureteral stent and a right retrograde pyelogram with ureteroscopy, possible biopsy and possible stent replacement as an outpatient.

## 2018-01-21 NOTE — Anesthesia Preprocedure Evaluation (Addendum)
Anesthesia Evaluation  Patient identified by MRN, date of birth, ID band Patient awake    Reviewed: Allergy & Precautions, NPO status , Patient's Chart, lab work & pertinent test results  History of Anesthesia Complications Negative for: history of anesthetic complications  Airway Mallampati: II  TM Distance: >3 FB Neck ROM: Full    Dental no notable dental hx. (+) Dental Advisory Given, Teeth Intact   Pulmonary neg pulmonary ROS,    Pulmonary exam normal        Cardiovascular hypertension, Pt. on medications + Peripheral Vascular Disease  Normal cardiovascular exam Rhythm:Regular Rate:Tachycardia  Impressions:  - Normal LV systolic function. No significant valvular disease.     Small pericardial effusion noted, but no evidence of hemodynamic   compromise; IVC not dilated and has normal respiratory change, no   significant change in mitral inflow velocities with respiration,   no RV collapse. RA walls not well visualized in subcostal views   but no clear collapse.     Elevated PA and RV systolic pressures with normal RA pressure.    Neuro/Psych TIA   GI/Hepatic negative GI ROS, Neg liver ROS,   Endo/Other  negative endocrine ROS  Renal/GU Renal InsufficiencyRenal disease     Musculoskeletal negative musculoskeletal ROS (+)   Abdominal   Peds  Hematology negative hematology ROS (+)   Anesthesia Other Findings Day of surgery medications reviewed with the patient.  Reproductive/Obstetrics                            Anesthesia Physical  Anesthesia Plan  ASA: III  Anesthesia Plan: General   Post-op Pain Management:    Induction: Intravenous  PONV Risk Score and Plan: 4 or greater and Ondansetron, Dexamethasone and Treatment may vary due to age or medical condition  Airway Management Planned: LMA  Additional Equipment:   Intra-op Plan:   Post-operative Plan: Extubation  in OR  Informed Consent: I have reviewed the patients History and Physical, chart, labs and discussed the procedure including the risks, benefits and alternatives for the proposed anesthesia with the patient or authorized representative who has indicated his/her understanding and acceptance.   Dental advisory given  Plan Discussed with: CRNA and Anesthesiologist  Anesthesia Plan Comments:         Anesthesia Quick Evaluation

## 2018-01-22 ENCOUNTER — Encounter (HOSPITAL_BASED_OUTPATIENT_CLINIC_OR_DEPARTMENT_OTHER)
Admission: RE | Disposition: A | Discharge status: Home / Self Care | Payer: Self-pay | Source: Ambulatory Visit | Attending: Urology

## 2018-01-22 ENCOUNTER — Ambulatory Visit (HOSPITAL_BASED_OUTPATIENT_CLINIC_OR_DEPARTMENT_OTHER)
Admission: RE | Admit: 2018-01-22 | Discharge: 2018-01-22 | Disposition: A | Discharge status: Home / Self Care | Payer: Medicare Other | Source: Ambulatory Visit | Attending: Urology | Admitting: Urology

## 2018-01-22 ENCOUNTER — Ambulatory Visit (HOSPITAL_BASED_OUTPATIENT_CLINIC_OR_DEPARTMENT_OTHER): Payer: Medicare Other | Admitting: Anesthesiology

## 2018-01-22 ENCOUNTER — Encounter (HOSPITAL_BASED_OUTPATIENT_CLINIC_OR_DEPARTMENT_OTHER): Payer: Self-pay | Admitting: *Deleted

## 2018-01-22 DIAGNOSIS — N1330 Unspecified hydronephrosis: Secondary | ICD-10-CM

## 2018-01-22 DIAGNOSIS — Z882 Allergy status to sulfonamides status: Secondary | ICD-10-CM | POA: Diagnosis not present

## 2018-01-22 DIAGNOSIS — N136 Pyonephrosis: Secondary | ICD-10-CM | POA: Insufficient documentation

## 2018-01-22 HISTORY — DX: Other specified disorders of kidney and ureter: N28.89

## 2018-01-22 HISTORY — DX: Occlusion and stenosis of bilateral carotid arteries: I65.23

## 2018-01-22 HISTORY — DX: Obstruction of duodenum: K31.5

## 2018-01-22 HISTORY — DX: Atrophy of kidney (terminal): N26.1

## 2018-01-22 HISTORY — DX: Personal history of other diseases of the digestive system: Z87.19

## 2018-01-22 HISTORY — DX: Personal history of colonic polyps: Z86.010

## 2018-01-22 HISTORY — PX: CYSTOSCOPY WITH URETEROSCOPY AND STENT PLACEMENT: SHX6377

## 2018-01-22 HISTORY — DX: Presence of spectacles and contact lenses: Z97.3

## 2018-01-22 HISTORY — DX: Personal history of colon polyps, unspecified: Z86.0100

## 2018-01-22 HISTORY — DX: Hypothyroidism, unspecified: E03.9

## 2018-01-22 HISTORY — DX: Unspecified hydronephrosis: N13.30

## 2018-01-22 HISTORY — DX: Cardiomegaly: I51.7

## 2018-01-22 HISTORY — DX: Personal history of other endocrine, nutritional and metabolic disease: Z86.39

## 2018-01-22 HISTORY — DX: Unspecified atherosclerosis: I70.90

## 2018-01-22 HISTORY — DX: Unspecified osteoarthritis, unspecified site: M19.90

## 2018-01-22 HISTORY — DX: Cyst of pancreas: K86.2

## 2018-01-22 HISTORY — DX: Unspecified malignant neoplasm of skin, unspecified: C44.90

## 2018-01-22 HISTORY — DX: Peripheral vascular disease, unspecified: I73.9

## 2018-01-22 HISTORY — DX: Diverticulosis of intestine, part unspecified, without perforation or abscess without bleeding: K57.90

## 2018-01-22 HISTORY — DX: Pneumonia, unspecified organism: J18.9

## 2018-01-22 HISTORY — DX: Atelectasis: J98.11

## 2018-01-22 SURGERY — CYSTOURETEROSCOPY, WITH STENT INSERTION
Anesthesia: General | Laterality: Right

## 2018-01-22 MED ORDER — HYDROCODONE-ACETAMINOPHEN 10-325 MG PO TABS: 1.0000 | ORAL_TABLET | ORAL | 0 refills | Status: DC | PRN

## 2018-01-22 MED ORDER — LABETALOL HCL 5 MG/ML IV SOLN
INTRAVENOUS | Status: AC
  Filled 2018-01-22: qty 4

## 2018-01-22 MED ORDER — DEXAMETHASONE SODIUM PHOSPHATE 10 MG/ML IJ SOLN
INTRAMUSCULAR | Status: DC | PRN
  Administered 2018-01-22: 10 mg via INTRAVENOUS

## 2018-01-22 MED ORDER — HYDROMORPHONE HCL 1 MG/ML IJ SOLN
1.0000 mg | INTRAMUSCULAR | Status: DC | PRN
  Administered 2018-01-22: 0.5 mg via INTRAVENOUS
  Filled 2018-01-22: qty 1

## 2018-01-22 MED ORDER — PROMETHAZINE HCL 25 MG/ML IJ SOLN
6.2500 mg | INTRAMUSCULAR | Status: DC | PRN
  Administered 2018-01-22: 6.26 mg via INTRAVENOUS
  Filled 2018-01-22: qty 1

## 2018-01-22 MED ORDER — PHENAZOPYRIDINE HCL 100 MG PO TABS
ORAL_TABLET | ORAL | Status: AC
  Filled 2018-01-22: qty 2

## 2018-01-22 MED ORDER — CIPROFLOXACIN HCL 500 MG PO TABS: 500.0000 mg | ORAL_TABLET | Freq: Two times a day (BID) | ORAL | 0 refills | Status: DC

## 2018-01-22 MED ORDER — LIDOCAINE 2% (20 MG/ML) 5 ML SYRINGE
INTRAMUSCULAR | Status: AC
  Filled 2018-01-22: qty 5

## 2018-01-22 MED ORDER — LIDOCAINE 2% (20 MG/ML) 5 ML SYRINGE
INTRAMUSCULAR | Status: DC | PRN
  Administered 2018-01-22: 25 mg via INTRAVENOUS

## 2018-01-22 MED ORDER — SODIUM CHLORIDE 0.9 % IR SOLN
Status: DC | PRN
  Administered 2018-01-22: 3000 mL via INTRAVESICAL

## 2018-01-22 MED ORDER — ONDANSETRON HCL 4 MG/2ML IJ SOLN
INTRAMUSCULAR | Status: AC
  Filled 2018-01-22: qty 2

## 2018-01-22 MED ORDER — HYDRALAZINE HCL 20 MG/ML IJ SOLN
INTRAMUSCULAR | Status: AC
  Filled 2018-01-22: qty 1

## 2018-01-22 MED ORDER — ARTIFICIAL TEARS OPHTHALMIC OINT
TOPICAL_OINTMENT | OPHTHALMIC | Status: AC
  Filled 2018-01-22: qty 3.5

## 2018-01-22 MED ORDER — HYDROMORPHONE HCL 1 MG/ML IJ SOLN
INTRAMUSCULAR | Status: AC
  Filled 2018-01-22: qty 1

## 2018-01-22 MED ORDER — FENTANYL CITRATE (PF) 100 MCG/2ML IJ SOLN
25.0000 ug | INTRAMUSCULAR | Status: DC | PRN
  Administered 2018-01-22 (×2): 25 ug via INTRAVENOUS
  Filled 2018-01-22: qty 1

## 2018-01-22 MED ORDER — ONDANSETRON HCL 4 MG/2ML IJ SOLN
INTRAMUSCULAR | Status: DC | PRN
  Administered 2018-01-22: 4 mg via INTRAVENOUS

## 2018-01-22 MED ORDER — PHENAZOPYRIDINE HCL 200 MG PO TABS: 200.0000 mg | ORAL_TABLET | Freq: Three times a day (TID) | ORAL | 0 refills | Status: DC | PRN

## 2018-01-22 MED ORDER — KETOROLAC TROMETHAMINE 15 MG/ML IJ SOLN
15.0000 mg | Freq: Once | INTRAMUSCULAR | Status: AC
  Administered 2018-01-22: 15 mg via INTRAVENOUS
  Filled 2018-01-22: qty 1

## 2018-01-22 MED ORDER — PHENAZOPYRIDINE HCL 200 MG PO TABS
200.0000 mg | ORAL_TABLET | Freq: Three times a day (TID) | ORAL | Status: DC
  Administered 2018-01-22: 200 mg via ORAL
  Filled 2018-01-22: qty 1

## 2018-01-22 MED ORDER — FENTANYL CITRATE (PF) 100 MCG/2ML IJ SOLN
INTRAMUSCULAR | Status: AC
  Filled 2018-01-22: qty 2

## 2018-01-22 MED ORDER — SODIUM CHLORIDE 0.9 % IV SOLN
INTRAVENOUS | Status: DC
  Administered 2018-01-22 (×2): via INTRAVENOUS
  Filled 2018-01-22: qty 1000

## 2018-01-22 MED ORDER — KETOROLAC TROMETHAMINE 30 MG/ML IJ SOLN
INTRAMUSCULAR | Status: AC
  Filled 2018-01-22: qty 1

## 2018-01-22 MED ORDER — CIPROFLOXACIN IN D5W 400 MG/200ML IV SOLN
INTRAVENOUS | Status: AC
  Filled 2018-01-22: qty 200

## 2018-01-22 MED ORDER — PROPOFOL 10 MG/ML IV BOLUS
INTRAVENOUS | Status: AC
  Filled 2018-01-22: qty 20

## 2018-01-22 MED ORDER — PROMETHAZINE HCL 25 MG/ML IJ SOLN
INTRAMUSCULAR | Status: AC
  Filled 2018-01-22: qty 1

## 2018-01-22 MED ORDER — HYDROCODONE-ACETAMINOPHEN 5-325 MG PO TABS
ORAL_TABLET | ORAL | Status: AC
  Filled 2018-01-22: qty 1

## 2018-01-22 MED ORDER — HYDROCODONE-ACETAMINOPHEN 10-325 MG PO TABS
ORAL_TABLET | ORAL | Status: AC
  Filled 2018-01-22: qty 1

## 2018-01-22 MED ORDER — LABETALOL HCL 5 MG/ML IV SOLN
10.0000 mg | INTRAVENOUS | Status: DC | PRN
  Administered 2018-01-22: 10 mg via INTRAVENOUS
  Filled 2018-01-22: qty 4

## 2018-01-22 MED ORDER — CIPROFLOXACIN IN D5W 400 MG/200ML IV SOLN
400.0000 mg | Freq: Once | INTRAVENOUS | Status: AC
  Administered 2018-01-22: 400 mg via INTRAVENOUS
  Filled 2018-01-22: qty 200

## 2018-01-22 MED ORDER — HYDROCODONE-ACETAMINOPHEN 5-325 MG PO TABS
1.0000 | ORAL_TABLET | ORAL | Status: DC | PRN
  Administered 2018-01-22: 1 via ORAL
  Filled 2018-01-22: qty 1

## 2018-01-22 MED ORDER — PROPOFOL 10 MG/ML IV BOLUS
INTRAVENOUS | Status: DC | PRN
  Administered 2018-01-22: 150 mg via INTRAVENOUS

## 2018-01-22 MED ORDER — DEXAMETHASONE SODIUM PHOSPHATE 10 MG/ML IJ SOLN
INTRAMUSCULAR | Status: AC
  Filled 2018-01-22: qty 1

## 2018-01-22 MED ORDER — HYDRALAZINE HCL 20 MG/ML IJ SOLN
10.0000 mg | INTRAMUSCULAR | Status: DC | PRN
  Administered 2018-01-22: 10 mg via INTRAVENOUS
  Filled 2018-01-22: qty 0.5

## 2018-01-22 MED ORDER — HYDROCODONE-ACETAMINOPHEN 10-325 MG PO TABS
1.0000 | ORAL_TABLET | ORAL | Status: DC | PRN
  Administered 2018-01-22: 1 via ORAL
  Filled 2018-01-22: qty 1

## 2018-01-22 MED ORDER — FENTANYL CITRATE (PF) 100 MCG/2ML IJ SOLN
INTRAMUSCULAR | Status: DC | PRN
  Administered 2018-01-22 (×4): 25 ug via INTRAVENOUS

## 2018-01-22 MED ORDER — LABETALOL HCL 5 MG/ML IV SOLN
INTRAVENOUS | Status: DC | PRN
  Administered 2018-01-22 (×2): 5 mg via INTRAVENOUS

## 2018-01-22 MED ORDER — HYDROCODONE-ACETAMINOPHEN 5-325 MG PO TABS
1.0000 | ORAL_TABLET | Freq: Once | ORAL | Status: AC
  Administered 2018-01-22: 1 via ORAL
  Filled 2018-01-22: qty 1

## 2018-01-22 MED ORDER — IOHEXOL 300 MG/ML  SOLN
INTRAMUSCULAR | Status: DC | PRN
  Administered 2018-01-22: 30 mL via URETHRAL

## 2018-01-22 SURGICAL SUPPLY — 25 items
BAG DRAIN URO-CYSTO SKYTR STRL (DRAIN) ×2 IMPLANT
BASKET ZERO TIP NITINOL 2.4FR (BASKET) IMPLANT
CATH INTERMIT  6FR 70CM (CATHETERS) IMPLANT
CATH URET 5FR 28IN CONE TIP (BALLOONS)
CATH URET 5FR 70CM CONE TIP (BALLOONS) IMPLANT
CLOTH BEACON ORANGE TIMEOUT ST (SAFETY) ×2 IMPLANT
EXTRACTOR STONE 1.7FRX115CM (UROLOGICAL SUPPLIES) IMPLANT
FIBER LASER FLEXIVA 365 (UROLOGICAL SUPPLIES) IMPLANT
FIBER LASER TRAC TIP (UROLOGICAL SUPPLIES) IMPLANT
FORCEPS BIOP 2.4F 115CM BACKLD (INSTRUMENTS) ×2 IMPLANT
GLOVE BIO SURGEON STRL SZ8 (GLOVE) ×2 IMPLANT
GOWN STRL REUS W/TWL XL LVL3 (GOWN DISPOSABLE) ×2 IMPLANT
GUIDEWIRE 0.038 PTFE COATED (WIRE) IMPLANT
GUIDEWIRE ANG ZIPWIRE 038X150 (WIRE) IMPLANT
GUIDEWIRE STR DUAL SENSOR (WIRE) ×4 IMPLANT
INFUSOR MANOMETER BAG 3000ML (MISCELLANEOUS) ×2 IMPLANT
IV NS IRRIG 3000ML ARTHROMATIC (IV SOLUTION) ×4 IMPLANT
KIT TURNOVER CYSTO (KITS) ×2 IMPLANT
MANIFOLD NEPTUNE II (INSTRUMENTS) ×2 IMPLANT
NS IRRIG 500ML POUR BTL (IV SOLUTION) ×2 IMPLANT
PACK CYSTO (CUSTOM PROCEDURE TRAY) ×2 IMPLANT
STENT CONTOUR 8FR X 24 (STENTS) ×2 IMPLANT
TUBE CONNECTING 12X1/4 (SUCTIONS) ×2 IMPLANT
TUBING UROLOGY SET (TUBING) ×2 IMPLANT
WATER STERILE IRR 3000ML UROMA (IV SOLUTION) IMPLANT

## 2018-01-22 NOTE — Anesthesia Postprocedure Evaluation (Signed)
Anesthesia Post Note  Patient: Brooke Walker  Procedure(s) Performed: CYSTOSCOPY WITH URETEROSCOPY / STENT REMOVAL/ BIOPSY AND STENT PLACEMENT (Right )     Patient location during evaluation: PACU Anesthesia Type: General Level of consciousness: awake and alert Pain management: pain level controlled Vital Signs Assessment: post-procedure vital signs reviewed and stable Respiratory status: spontaneous breathing, nonlabored ventilation, respiratory function stable and patient connected to nasal cannula oxygen Cardiovascular status: blood pressure returned to baseline and stable Postop Assessment: no apparent nausea or vomiting Anesthetic complications: no    Last Vitals:  Vitals:   01/22/18 1035 01/22/18 1045  BP: (!) 161/85 (!) 179/81  Pulse: 82 87  Resp: 18   Temp:    SpO2: 96%     Last Pain:  Vitals:   01/22/18 1130  TempSrc:   PainSc: 10-Worst pain ever                 Tiajuana Amass

## 2018-01-22 NOTE — Discharge Instructions (Signed)
Post Anesthesia Home Care Instructions  Activity: Get plenty of rest for the remainder of the day. A responsible adult should stay with you for 24 hours following the procedure.  For the next 24 hours, DO NOT: -Drive a car -Paediatric nurse -Drink alcoholic beverages -Take any medication unless instructed by your physician -Make any legal decisions or sign important papers.  Meals: Start with liquid foods such as gelatin or soup. Progress to regular foods as tolerated. Avoid greasy, spicy, heavy foods. If nausea and/or vomiting occur, drink only clear liquids until the nausea and/or vomiting subsides. Call your physician if vomiting continues.  Special Instructions/Symptoms: Your throat may feel dry or sore from the anesthesia or the breathing tube placed in your throat during surgery. If this causes discomfort, gargle with warm salt water. The discomfort should disappear within 24 hours.  If you had a scopolamine patch placed behind your ear for the management of post- operative nausea and/or vomiting:  1. The medication in the patch is effective for 72 hours, after which it should be removed.  Wrap patch in a tissue and discard in the trash. Wash hands thoroughly with soap and water. 2. You may remove the patch earlier than 72 hours if you experience unpleasant side effects which may include dry mouth, dizziness or visual disturbances. 3. Avoid touching the patch. Wash your hands with soap and water after contact with the patch.      Post stent placement instructions   Definitions:  Ureter: The duct that transports urine from the kidney to the bladder. Stent: A plastic hollow tube that is placed into the ureter, from the kidney to the bladder to prevent the ureter from swelling shut.  General instructions:  Despite the fact that no skin incisions were used, the area around the ureter and bladder is raw and irritated. The stent is a foreign body which can further irritate the  bladder wall. This irritation is manifested by increased frequency of urination, both day and night, and by an increase in the urge to urinate. In some, the urge to urinate is present almost always. Sometimes the urge is strong enough that you may not be able to stop your self from urinating. This can often be controlled with medication but does not occur in everyone. A stent can safely be left in place for 3 months or greater.  You may see some blood in your urine while the stent is in place and a few days afterward. Do not be alarmed, even if the urine is clear for a while. Get off your feet and drink lots of fluids until clearing occurs. If you start to pass clots or don't improve, call us.  Diet:  You may return to your normal diet immediately. Because of the raw surface of your bladder, alcohol, spicy foods, foods high in acid and drinks with caffeine may cause irritation or frequency and should be used in moderation. To keep your urine flowing freely and avoid constipation, drink plenty of fluids during the day (8-10 glasses). Tip: Avoid cranberry juice because it is very acidic.  Activity:  Your physical activity doesn't need to be restricted. However, if you are very active, you may see some blood in the urine. We suggest that you reduce your activity under the circumstances until the bleeding has stopped.  Bowels:  It is important to keep your bowels regular during the postoperative period. Straining with bowel movements can cause bleeding. A bowel movement every other day is reasonable. Use  a mild laxative if needed, such as milk of magnesia 2-3 tablespoons, or 2 Dulcolax tablets. Call if you continue to have problems. If you had been taking narcotics for pain, before, during or after your surgery, you may be constipated. Take a laxative if necessary.  Medication:  You should resume your pre-surgery medications unless told not to. In addition you may be given an antibiotic to prevent or  treat infection. Antibiotics are not always necessary. All medication should be taken as prescribed until the bottles are finished unless you are having an unusual reaction to one of the drugs.  Problems you should report to [place]:  a. Fever greater than 101F. b. Heavy bleeding, or clots (see notes above about blood in urine). c. Inability to urinate. d. Drug reactions (hives, rash, nausea, vomiting, diarrhea). e. Severe burning or pain with urination that is not improving.

## 2018-01-22 NOTE — Transfer of Care (Signed)
Immediate Anesthesia Transfer of Care Note  Patient: Brooke Walker  Procedure(s) Performed: CYSTOSCOPY WITH URETEROSCOPY / STENT REMOVAL/ BIOPSY AND STENT PLACEMENT (Right )  Patient Location: PACU  Anesthesia Type:General  Level of Consciousness: awake, alert , oriented and patient cooperative  Airway & Oxygen Therapy: Patient Spontanous Breathing and Patient connected to nasal cannula oxygen  Post-op Assessment: Report given to RN and Post -op Vital signs reviewed and stable  Post vital signs: Reviewed and stable  Last Vitals:  Vitals Value Taken Time  BP    Temp    Pulse 73 01/22/2018  8:31 AM  Resp    SpO2 100 % 01/22/2018  8:31 AM  Vitals shown include unvalidated device data.  Last Pain:  Vitals:   01/22/18 0545  TempSrc: Oral         Complications: No apparent anesthesia complications

## 2018-01-22 NOTE — Anesthesia Procedure Notes (Signed)
Procedure Name: LMA Insertion Date/Time: 01/22/2018 7:37 AM Performed by: Wanita Chamberlain, CRNA Pre-anesthesia Checklist: Timeout performed, Patient identified, Emergency Drugs available, Suction available and Patient being monitored Patient Re-evaluated:Patient Re-evaluated prior to induction Oxygen Delivery Method: Circle system utilized Preoxygenation: Pre-oxygenation with 100% oxygen Induction Type: IV induction Ventilation: Mask ventilation without difficulty LMA: LMA inserted LMA Size: 4.0 Number of attempts: 1 Intubation method: gauze b/t central incisors. Placement Confirmation: breath sounds checked- equal and bilateral,  CO2 detector and positive ETCO2 Tube secured with: Tape Dental Injury: Teeth and Oropharynx as per pre-operative assessment

## 2018-01-22 NOTE — Op Note (Signed)
PATIENT:  Brooke Walker  PRE-OPERATIVE DIAGNOSIS: 1.  History of pyocystis. 2.  Right ureteral obstruction with hydronephrosis  POST-OPERATIVE DIAGNOSIS: 1.  Persistent pyocystis 2.  Right ureteral obstruction  PROCEDURE: 1.  Cystoscopy with right double-J stent removal. 2.  Right retrograde pyelogram with interpretation. 3.  Right ureteroscopy with ureteral biopsy. 4.  Right double-J stent placement.  SURGEON:  Claybon Jabs  INDICATION: Brooke Walker is a 84 year old female who was seen in the hospital on 12/21/2017 with hydronephrosis that appeared but it had been present for at least 6 months and she underwent a stent placement with grossly purulent urine found in the kidney that grew pan sensitive E. coli.  She has been maintained on antibiotics and is been afebrile and asymptomatic and returns for further evaluation of the ureter to determine the cause of her hydronephrosis.  ANESTHESIA:  General  EBL:  Minimal  DRAINS: 8 French, 24 cm double-J stent (no string)  LOCAL MEDICATIONS USED:  None  SPECIMEN: Urine from the right kidney for aerobic, anaerobic and fungal culture.  Description of procedure: After informed consent the patient was taken to the operating room and placed on the table in a supine position. General anesthesia was then administered. Once fully anesthetized the patient was moved to the dorsal lithotomy position and the genitalia were sterilely prepped and draped in standard fashion. An official timeout was then performed.  The 23 French cystoscope with 30 degree lens was introduced into the bladder and the bladder was again noted to be free of any tumors, stones or inflammatory lesions.  The stent was identified and grasped with a alligator grasper at its tip and withdrawn through the urethral meatus.  I then passed a 0.038 inch floppy tip sensor guidewire through the stent and into the area the renal pelvis under fluoroscopy.  The stent was then removed.  A 6  French open-ended catheter was then passed over the guidewire and into the area the renal pelvis and the guidewire was removed.  I noted what appeared to be a hydronephrotic drip which was inconsistent with the fact that she had a stent indwelling for 1 month.  I performed a right retrograde pyelogram by injecting full-strength Omnipaque contrast through the open-ended catheter and into the kidney which was noted to have blunted calyces and a large renal pelvis consistent with long-standing obstruction and hydronephrosis.  I then slowly withdrew the open-ended catheter down the ureter while injecting contrast under fluoroscopy and noted some slight tortuosity of the ureter just below the ureteropelvic junction although it did not appear to be obstructing.  Approximately mid ureter I again noted some irregularity of the ureter and this persisted down to about the lower border of the sacrum.  I then reinserted the guidewire through the open-ended catheter and left this in place removing the open-ended catheter.  The 6 French rigid ureteroscope was then passed under direct vision into the right UO and up the right ureter.  When I got the scope to about the level of the lower border of the sacrum I noted some resistance with passing the scope although I was able to pass this further on top of the guidewire but felt it would be safer to pass a second guidewire and therefore placed the second guidewire through the scope and then passed the scope over the guidewire up the ureter using intermittent fluoroscopy as well.  This allowed me to advance the scope all the way into the area the renal  pelvis and I found in the pelvis what appeared to be purulent material.  I therefore used a 30 cc syringe and withdrew approximately 60 cc of what appeared to be grossly purulent material that was sent for culture.  With the kidney drained I then backed the scope down the ureter and did not note any evidence of a UPJ obstruction but  did note that the ureter was quite fixed.  As I backed the scope down the ureter I was not able to definitely identify any papillary lesions there did appear to be some possible inflammatory lesions and so I obtained biopsies at several locations in the ureter as I advance the scope back down the ureter.  These were small biopsies used with the ureteroscopic biopsy instrument and therefore I also removed the ureteroscope and inserted the Bigopsy into the ureteroscope and passed this back up the ureter to where it first started to become narrowed and obtained a biopsy from this location as well.  I removed the ureteroscope and then backloaded the cystoscope of the guidewire.  I chose an 8 French stent in order to maximize drainage as I suspect the kidney has very little urine output based on its hydronephrotic state and the fact that it has very thin parenchyma felt secondary to the effects of radiation to the kidney for her right adrenal cancer.  I positioned the stent in the most dependent portion of the renal pelvis and then remove the guidewire with good curl in this location.  This was documented fluoroscopically.  I then drained the bladder, removed the cystoscope and the patient was awakened and taken to the recovery room in stable and satisfactory condition.  She tolerated procedure well no intraoperative complications.  She will be maintained on antibiotics upon discharge and adjustments to antibiotics will be made according to sensitivities as necessary.  She will then return to follow-up with me to go over the results of her biopsies and further management.   PLAN OF CARE: Discharge to home after PACU  PATIENT DISPOSITION:  PACU - hemodynamically stable.

## 2018-01-23 ENCOUNTER — Encounter (HOSPITAL_BASED_OUTPATIENT_CLINIC_OR_DEPARTMENT_OTHER): Payer: Self-pay | Admitting: Urology

## 2018-01-24 NOTE — Addendum Note (Signed)
Addendum  created 01/24/18 0815 by Wanita Chamberlain, CRNA   Charge Capture section accepted

## 2018-01-25 LAB — AEROBIC CULTURE W GRAM STAIN (SUPERFICIAL SPECIMEN): Culture: NO GROWTH

## 2018-01-27 LAB — ANAEROBIC CULTURE

## 2018-02-15 ENCOUNTER — Ambulatory Visit (INDEPENDENT_AMBULATORY_CARE_PROVIDER_SITE_OTHER): Payer: Medicare Other | Admitting: Family Medicine

## 2018-02-15 ENCOUNTER — Encounter: Payer: Self-pay | Admitting: Family Medicine

## 2018-02-15 VITALS — BP 190/60 | HR 96 | Temp 98.1°F | Ht 63.0 in | Wt 114.6 lb

## 2018-02-15 DIAGNOSIS — I10 Essential (primary) hypertension: Secondary | ICD-10-CM

## 2018-02-15 DIAGNOSIS — L409 Psoriasis, unspecified: Secondary | ICD-10-CM | POA: Diagnosis not present

## 2018-02-15 DIAGNOSIS — K862 Cyst of pancreas: Secondary | ICD-10-CM | POA: Diagnosis not present

## 2018-02-15 DIAGNOSIS — N2889 Other specified disorders of kidney and ureter: Secondary | ICD-10-CM

## 2018-02-15 DIAGNOSIS — N183 Chronic kidney disease, stage 3 unspecified: Secondary | ICD-10-CM

## 2018-02-15 DIAGNOSIS — E039 Hypothyroidism, unspecified: Secondary | ICD-10-CM

## 2018-02-15 DIAGNOSIS — Z23 Encounter for immunization: Secondary | ICD-10-CM

## 2018-02-15 MED ORDER — ZOSTER VAC RECOMB ADJUVANTED 50 MCG/0.5ML IM SUSR: 0.5000 mL | Freq: Once | INTRAMUSCULAR | 0 refills | Status: AC

## 2018-02-15 NOTE — Progress Notes (Signed)
Brooke Walker DOB: 1930/09/07 Encounter date: 02/15/2018  This is a 84 y.o. female who presents to establish care. Chief Complaint  Patient presents with  . New Patient (Initial Visit)    no new concerns, flu shot, discuss pneu. vaccince and shingrix    History of present illness:  Had cancer in 1986 adrenal. Hadn't had any problem until recently when she had severe renal infection. Ended up getting stent put in right kidney; had stenting x 2 with removal. On antibiotics for another month. Has had it rough. Feeling better in terms of pain. Seeing urology. Talking about if infections recur she would need stent placed more permanently or removal of kidney.   Appetite is somewhat poor; relates to blood pressure medication. Has had a difficult time controlling blood pressure for last couple of years. BP this morning was 140/60 but then spikes up systolic bp intermittently. No issues with low bp; not light headed/dizzy. Biggest side effect is loss of appetite.   Had appointment with nephrology but was in rehab so had to miss appointment but is rescheduled for later this month.    TIA 20 years ago. Has some blockage of carotids, but those have been stable. Sees vascular specialist for this. Has had stenting in mesentary x 2. First in 2017, repeated in 2018.  Thyroid enlarged at age of 47. It was removed. Has been on some form of synthroid since that time.   Psoriasis scalp much improved; saw dermatology.   Takes benadryl for restless legs which helps. Mild tingling (has been called neuropathy by some)  Mass left kidney; monitoring this.   Past Medical History:  Diagnosis Date  . Adrenal cancer, right ([place]) 1996  . AKI (acute kidney injury) ([place])   . Anemia   . Arthritis   . Atherosclerosis   . Atrophy of right kidney   . Cardiomegaly 11/03/2017   noted on CXR  . Carotid stenosis, bilateral    Moderate 60-79%  . CKD (chronic kidney disease)    stage 2-3  . Coronary artery disease   .  Diverticular disease   . Duodenal obstruction 05/11/2017  . History of colon polyps   . History of goiter   . History of pancreatitis   . Hydronephrosis of right kidney   . Hyperlipidemia   . Hypertension   . Hypothyroidism   . LVH (left ventricular hypertrophy)    Mild  . Mass of left kidney 12/20/2017   mixed cystic and solid enhancing mass in the midpole left kidney, noted on CT  . Mesenteric ischemia ([place])   . Mild bibasilar atelectasis 12/2017  . PAD (peripheral artery disease) ([place])   . Pancreas cyst 12/20/2017   cysts in the tail of the pancreas noted on CT  . Pericardial effusion    Trivial  . Peripheral polyneuropathy   . Pneumonia   . Psoriasis    Scalp  . Renal insufficiency   . Restless leg syndrome   . Skin cancer    squamous  . SVT (supraventricular tachycardia) ([place])   . Transient ischemic attack (TIA)   . Wears glasses    Past Surgical History:  Procedure Laterality Date  . ABDOMINAL HYSTERECTOMY  1975   excessive bleeding  . ADRENALECTOMY Right    with radiation  . APPENDECTOMY    . CHOLECYSTECTOMY    . COLONOSCOPY    . CYSTOSCOPY WITH RETROGRADE PYELOGRAM, URETEROSCOPY AND STENT PLACEMENT Right 12/22/2017   Procedure: CYSTOSCOPY WITH RETROGRADE PYELOGRAM, AND STENT PLACEMENT;  Surgeon: Kathie Rhodes, MD;  Location: [facility];  Service: Urology;  Laterality: Right;  . CYSTOSCOPY WITH URETEROSCOPY AND STENT PLACEMENT Right 01/22/2018   Procedure: CYSTOSCOPY WITH URETEROSCOPY / STENT REMOVAL/ BIOPSY AND STENT PLACEMENT;  Surgeon: Kathie Rhodes, MD;  Location: [facility];  Service: Urology;  Laterality: Right;  . Left cutdown  11/15/2016  . mesenteric ischemia stents  04/12/2017   x2  . RENAL ARTERY STENT Left 04/12/2017  . THYROIDECTOMY    . UPPER GI ENDOSCOPY    . URETERAL STENT PLACEMENT     Allergies  Allergen Reactions  . Amoxicillin     Other reaction(s): Other (See Comments) Liver infection per pt report Caused liver infection    . Clonidine Anaphylaxis  . Sulfa Antibiotics Nausea Only  . Sulfamethoxazole Nausea And Vomiting  . Clonidine Derivatives   . Metoprolol     Other reaction(s): Dizziness (intolerance) syncope   Current Meds  Medication Sig  . amLODipine (NORVASC) 10 MG tablet Take 1 tablet (10 mg total) by mouth daily.  Marland Kitchen Apremilast (OTEZLA PO) Take 30 mg by mouth daily.   Marland Kitchen aspirin EC 81 MG tablet Take by mouth.  . clopidogrel (PLAVIX) 75 MG tablet TAKE 1 TABLET BY MOUTH EVERY DAY  . diphenhydrAMINE (BENADRYL) 25 MG tablet Take 25 mg by mouth at bedtime.  . docusate sodium (COLACE) 100 MG capsule Take 100 mg by mouth 2 (two) times daily.  Marland Kitchen estradiol (ESTRACE) 0.1 MG/GM vaginal cream Apply a pea-sized amount within vagina 2-3 times per week  . hydrALAZINE (APRESOLINE) 100 MG tablet Take 1 tablet (100 mg total) by mouth 3 (three) times daily. (Patient taking differently: Take 50 mg by mouth 2 (two) times daily. )  . hydrochlorothiazide (HYDRODIURIL) 25 MG tablet Take 1 tablet (25 mg total) by mouth daily.  Marland Kitchen ketoconazole (NIZORAL) 2 % shampoo Apply 1 application topically 3 (three) times a week. Use three times weekly for two weeks.  Marland Kitchen levothyroxine (SYNTHROID, LEVOTHROID) 112 MCG tablet TAKE 1 TABLET (112 MCG TOTAL) BY MOUTH DAILY AT 6AM  . pantoprazole (PROTONIX) 40 MG tablet Take 40 mg by mouth 2 (two) times daily.  . polyethylene glycol (MIRALAX / GLYCOLAX) packet Take 17 g by mouth daily. (Patient taking differently: Take 17 g by mouth daily as needed. )  . pravastatin (PRAVACHOL) 40 MG tablet daily.   . [DISCONTINUED] ciprofloxacin (CIPRO) 500 MG tablet Take 1 tablet (500 mg total) by mouth 2 (two) times daily.  . [DISCONTINUED] HYDROcodone-acetaminophen (NORCO) 10-325 MG tablet Take 1-2 tablets by mouth every 4 (four) hours as needed for moderate pain. Maximum dose per 24 hours - 8 pills  . [DISCONTINUED] Melatonin 3 MG TABS Take 1 tablet by mouth at bedtime.  . [DISCONTINUED] phenazopyridine  (PYRIDIUM) 200 MG tablet Take 1 tablet (200 mg total) by mouth 3 (three) times daily as needed for pain.  . [DISCONTINUED] terazosin (HYTRIN) 5 MG capsule Take 1 capsule (5 mg total) by mouth at bedtime. (Patient taking differently: Take 5 mg by mouth daily. )   Social History   Tobacco Use  . Smoking status: Never Smoker  . Smokeless tobacco: Never Used  Substance Use Topics  . Alcohol use: Never    Frequency: Never   Family History  Problem Relation Age of Onset  . Colon cancer Mother   . Prostate cancer Father   . Heart attack Sister   . Heart disease Brother   . Heart disease Brother  Review of Systems  Constitutional: Negative for chills, fatigue and fever.  Respiratory: Negative for cough, chest tightness, shortness of breath and wheezing.   Cardiovascular: Negative for chest pain, palpitations and leg swelling.  Gastrointestinal: Negative for abdominal distention and abdominal pain.  Genitourinary: Negative for difficulty urinating.  Psychiatric/Behavioral: Negative for sleep disturbance.    Objective:  BP (!) 190/60 (BP Location: Left Arm, Patient Position: Sitting, Cuff Size: Normal)   Pulse 96   Temp 98.1 F (36.7 C) (Oral)   Ht 5\' 3"  (1.6 m)   Wt 114 lb 9.6 oz (52 kg)   SpO2 96%   BMI 20.30 kg/m   Weight: 114 lb 9.6 oz (52 kg)   BP Readings from Last 3 Encounters:  02/15/18 (!) 190/60  01/22/18 (!) 192/71  01/08/18 (!) 172/60   Wt Readings from Last 3 Encounters:  02/15/18 114 lb 9.6 oz (52 kg)  01/22/18 115 lb 4.8 oz (52.3 kg)  01/08/18 125 lb 6.4 oz (56.9 kg)    Physical Exam  Constitutional: She is oriented to person, place, and time. She appears well-developed and well-nourished. No distress.  Cardiovascular: Normal rate and regular rhythm. Exam reveals no friction rub.  Murmur heard.  Systolic murmur is present with a grade of 2/6. No lower extremity edema  Pulmonary/Chest: Effort normal and breath sounds normal. No respiratory distress.  She has no wheezes. She has no rales.  Neurological: She is alert and oriented to person, place, and time.  Psychiatric: She has a normal mood and affect. Her speech is normal and behavior is normal. Cognition and memory are normal.    Assessment/Plan:  1. Need for influenza vaccination - Flu vaccine HIGH DOSE PF (Fluzone High dose)  2. Need for pneumococcal vaccination - Pneumococcal polysaccharide vaccine 23-valent greater than or equal to 2yo subcutaneous/IM  3. Essential hypertension Labile. Cardiology has been following and adjusting medications; she has upcoming appointment with nephrology and will review with them. I did not make changes today since she has so much systolic fluctuation with stable diastolic AND since there were such recent changes from cardiology. Continue to monitor at home.  4. Pancreatic cyst Has been stable.  5. Acquired hypothyroidism Stable. Sees endocrinology.  6. Psoriasis Has been stable. Follows with derm. Not currently requiring topical treatment.  7. CKD (chronic kidney disease) stage 3, GFR 30-59 ml/min (HCC) Has upcoming appointment with nephrology.  8. Left renal mass Nephrology is monitoring.   Return in about 3 months (around 05/18/2018) for Chronic condition visit.  Micheline Rough, MD

## 2018-02-15 NOTE — Patient Instructions (Signed)
Consider adding supplement to diet (Fairlife chocolate milk).

## 2018-02-17 ENCOUNTER — Inpatient Hospital Stay (HOSPITAL_BASED_OUTPATIENT_CLINIC_OR_DEPARTMENT_OTHER)
Admission: EM | Admit: 2018-02-17 | Discharge: 2018-02-21 | DRG: 442 | Disposition: A | Discharge status: Home / Self Care | Payer: Medicare Other | Attending: Internal Medicine | Admitting: Internal Medicine

## 2018-02-17 ENCOUNTER — Emergency Department (HOSPITAL_BASED_OUTPATIENT_CLINIC_OR_DEPARTMENT_OTHER): Payer: Medicare Other

## 2018-02-17 ENCOUNTER — Other Ambulatory Visit: Payer: Self-pay

## 2018-02-17 ENCOUNTER — Encounter (HOSPITAL_BASED_OUTPATIENT_CLINIC_OR_DEPARTMENT_OTHER): Payer: Self-pay | Admitting: Adult Health

## 2018-02-17 DIAGNOSIS — Z66 Do not resuscitate: Secondary | ICD-10-CM | POA: Diagnosis not present

## 2018-02-17 DIAGNOSIS — T361X5A Adverse effect of cephalosporins and other beta-lactam antibiotics, initial encounter: Secondary | ICD-10-CM | POA: Diagnosis present

## 2018-02-17 DIAGNOSIS — Z79890 Hormone replacement therapy (postmenopausal): Secondary | ICD-10-CM

## 2018-02-17 DIAGNOSIS — L409 Psoriasis, unspecified: Secondary | ICD-10-CM | POA: Diagnosis not present

## 2018-02-17 DIAGNOSIS — G629 Polyneuropathy, unspecified: Secondary | ICD-10-CM | POA: Diagnosis present

## 2018-02-17 DIAGNOSIS — E785 Hyperlipidemia, unspecified: Secondary | ICD-10-CM | POA: Diagnosis not present

## 2018-02-17 DIAGNOSIS — N183 Chronic kidney disease, stage 3 (moderate): Secondary | ICD-10-CM | POA: Diagnosis not present

## 2018-02-17 DIAGNOSIS — L89312 Pressure ulcer of right buttock, stage 2: Secondary | ICD-10-CM | POA: Diagnosis not present

## 2018-02-17 DIAGNOSIS — K219 Gastro-esophageal reflux disease without esophagitis: Secondary | ICD-10-CM | POA: Diagnosis present

## 2018-02-17 DIAGNOSIS — G2581 Restless legs syndrome: Secondary | ICD-10-CM | POA: Diagnosis not present

## 2018-02-17 DIAGNOSIS — Z880 Allergy status to penicillin: Secondary | ICD-10-CM

## 2018-02-17 DIAGNOSIS — E440 Moderate protein-calorie malnutrition: Secondary | ICD-10-CM | POA: Diagnosis present

## 2018-02-17 DIAGNOSIS — D638 Anemia in other chronic diseases classified elsewhere: Secondary | ICD-10-CM | POA: Diagnosis not present

## 2018-02-17 DIAGNOSIS — E890 Postprocedural hypothyroidism: Secondary | ICD-10-CM | POA: Diagnosis not present

## 2018-02-17 DIAGNOSIS — M1990 Unspecified osteoarthritis, unspecified site: Secondary | ICD-10-CM | POA: Diagnosis present

## 2018-02-17 DIAGNOSIS — T360X5A Adverse effect of penicillins, initial encounter: Secondary | ICD-10-CM | POA: Diagnosis not present

## 2018-02-17 DIAGNOSIS — I129 Hypertensive chronic kidney disease with stage 1 through stage 4 chronic kidney disease, or unspecified chronic kidney disease: Secondary | ICD-10-CM | POA: Diagnosis present

## 2018-02-17 DIAGNOSIS — R945 Abnormal results of liver function studies: Secondary | ICD-10-CM | POA: Diagnosis present

## 2018-02-17 DIAGNOSIS — K759 Inflammatory liver disease, unspecified: Secondary | ICD-10-CM | POA: Diagnosis not present

## 2018-02-17 DIAGNOSIS — Z9049 Acquired absence of other specified parts of digestive tract: Secondary | ICD-10-CM

## 2018-02-17 DIAGNOSIS — R1110 Vomiting, unspecified: Secondary | ICD-10-CM

## 2018-02-17 DIAGNOSIS — R946 Abnormal results of thyroid function studies: Secondary | ICD-10-CM | POA: Diagnosis present

## 2018-02-17 DIAGNOSIS — B179 Acute viral hepatitis, unspecified: Principal | ICD-10-CM | POA: Diagnosis present

## 2018-02-17 DIAGNOSIS — Z681 Body mass index (BMI) 19 or less, adult: Secondary | ICD-10-CM

## 2018-02-17 DIAGNOSIS — I313 Pericardial effusion (noninflammatory): Secondary | ICD-10-CM | POA: Diagnosis not present

## 2018-02-17 DIAGNOSIS — N136 Pyonephrosis: Secondary | ICD-10-CM | POA: Diagnosis present

## 2018-02-17 DIAGNOSIS — R112 Nausea with vomiting, unspecified: Secondary | ICD-10-CM | POA: Diagnosis present

## 2018-02-17 DIAGNOSIS — Z90710 Acquired absence of both cervix and uterus: Secondary | ICD-10-CM | POA: Diagnosis not present

## 2018-02-17 DIAGNOSIS — Z882 Allergy status to sulfonamides status: Secondary | ICD-10-CM

## 2018-02-17 DIAGNOSIS — Z8673 Personal history of transient ischemic attack (TIA), and cerebral infarction without residual deficits: Secondary | ICD-10-CM

## 2018-02-17 DIAGNOSIS — I739 Peripheral vascular disease, unspecified: Secondary | ICD-10-CM | POA: Diagnosis not present

## 2018-02-17 DIAGNOSIS — Z85858 Personal history of malignant neoplasm of other endocrine glands: Secondary | ICD-10-CM

## 2018-02-17 DIAGNOSIS — Z86010 Personal history of colonic polyps: Secondary | ICD-10-CM

## 2018-02-17 DIAGNOSIS — R7989 Other specified abnormal findings of blood chemistry: Secondary | ICD-10-CM | POA: Diagnosis present

## 2018-02-17 DIAGNOSIS — K862 Cyst of pancreas: Secondary | ICD-10-CM | POA: Diagnosis present

## 2018-02-17 DIAGNOSIS — Z923 Personal history of irradiation: Secondary | ICD-10-CM

## 2018-02-17 DIAGNOSIS — Z79899 Other long term (current) drug therapy: Secondary | ICD-10-CM

## 2018-02-17 DIAGNOSIS — Z8711 Personal history of peptic ulcer disease: Secondary | ICD-10-CM

## 2018-02-17 DIAGNOSIS — Z8249 Family history of ischemic heart disease and other diseases of the circulatory system: Secondary | ICD-10-CM

## 2018-02-17 DIAGNOSIS — I2510 Atherosclerotic heart disease of native coronary artery without angina pectoris: Secondary | ICD-10-CM | POA: Diagnosis present

## 2018-02-17 DIAGNOSIS — Z87440 Personal history of urinary (tract) infections: Secondary | ICD-10-CM

## 2018-02-17 DIAGNOSIS — E860 Dehydration: Secondary | ICD-10-CM | POA: Diagnosis present

## 2018-02-17 DIAGNOSIS — Z888 Allergy status to other drugs, medicaments and biological substances status: Secondary | ICD-10-CM

## 2018-02-17 DIAGNOSIS — K8071 Calculus of gallbladder and bile duct without cholecystitis with obstruction: Secondary | ICD-10-CM

## 2018-02-17 DIAGNOSIS — Z7902 Long term (current) use of antithrombotics/antiplatelets: Secondary | ICD-10-CM

## 2018-02-17 DIAGNOSIS — L89322 Pressure ulcer of left buttock, stage 2: Secondary | ICD-10-CM | POA: Diagnosis not present

## 2018-02-17 DIAGNOSIS — L8990 Pressure ulcer of unspecified site, unspecified stage: Secondary | ICD-10-CM

## 2018-02-17 DIAGNOSIS — Z7982 Long term (current) use of aspirin: Secondary | ICD-10-CM

## 2018-02-17 DIAGNOSIS — Z85828 Personal history of other malignant neoplasm of skin: Secondary | ICD-10-CM

## 2018-02-17 HISTORY — DX: Tubulo-interstitial nephritis, not specified as acute or chronic: N12

## 2018-02-17 LAB — CBC
HEMATOCRIT: 36.5 % (ref 36.0–46.0)
HEMOGLOBIN: 11.9 g/dL — AB (ref 12.0–15.0)
MCH: 30.9 pg (ref 26.0–34.0)
MCHC: 32.6 g/dL (ref 30.0–36.0)
MCV: 94.8 fL (ref 78.0–100.0)
PLATELETS: 159 10*3/uL (ref 150–400)
RBC: 3.85 MIL/uL — AB (ref 3.87–5.11)
RDW: 14.1 % (ref 11.5–15.5)
WBC: 7.5 10*3/uL (ref 4.0–10.5)

## 2018-02-17 LAB — URINALYSIS, ROUTINE W REFLEX MICROSCOPIC
GLUCOSE, UA: NEGATIVE mg/dL
Hgb urine dipstick: NEGATIVE
Ketones, ur: 15 mg/dL — AB
LEUKOCYTES UA: NEGATIVE
Nitrite: NEGATIVE
PH: 6 (ref 5.0–8.0)
PROTEIN: 100 mg/dL — AB
SPECIFIC GRAVITY, URINE: 1.025 (ref 1.005–1.030)

## 2018-02-17 LAB — URINALYSIS, MICROSCOPIC (REFLEX)

## 2018-02-17 LAB — COMPREHENSIVE METABOLIC PANEL
ALT: 239 U/L — ABNORMAL HIGH (ref 0–44)
AST: 242 U/L — ABNORMAL HIGH (ref 15–41)
Albumin: 3.7 g/dL (ref 3.5–5.0)
Alkaline Phosphatase: 195 U/L — ABNORMAL HIGH (ref 38–126)
Anion gap: 12 (ref 5–15)
BUN: 20 mg/dL (ref 8–23)
CHLORIDE: 100 mmol/L (ref 98–111)
CO2: 24 mmol/L (ref 22–32)
Calcium: 9.3 mg/dL (ref 8.9–10.3)
Creatinine, Ser: 1.01 mg/dL — ABNORMAL HIGH (ref 0.44–1.00)
GFR, EST AFRICAN AMERICAN: 57 mL/min — AB (ref >60)
GFR, EST NON AFRICAN AMERICAN: 49 mL/min — AB (ref >60)
Glucose, Bld: 108 mg/dL — ABNORMAL HIGH (ref 70–99)
POTASSIUM: 4.2 mmol/L (ref 3.5–5.1)
SODIUM: 136 mmol/L (ref 135–145)
Total Bilirubin: 0.6 mg/dL (ref 0.3–1.2)
Total Protein: 7 g/dL (ref 6.5–8.1)

## 2018-02-17 LAB — LIPASE, BLOOD: LIPASE: 23 U/L (ref 11–51)

## 2018-02-17 MED ORDER — HYDRALAZINE HCL 50 MG PO TABS
100.0000 mg | ORAL_TABLET | Freq: Three times a day (TID) | ORAL | Status: DC
  Administered 2018-02-17 – 2018-02-21 (×13): 100 mg via ORAL
  Filled 2018-02-17 (×13): qty 2

## 2018-02-17 MED ORDER — ENOXAPARIN SODIUM 30 MG/0.3ML ~~LOC~~ SOLN
30.0000 mg | SUBCUTANEOUS | Status: DC
  Administered 2018-02-17 – 2018-02-18 (×2): 30 mg via SUBCUTANEOUS
  Filled 2018-02-17 (×3): qty 0.3

## 2018-02-17 MED ORDER — SACCHAROMYCES BOULARDII 250 MG PO CAPS
250.0000 mg | ORAL_CAPSULE | Freq: Two times a day (BID) | ORAL | Status: DC
  Administered 2018-02-17 – 2018-02-21 (×8): 250 mg via ORAL
  Filled 2018-02-17 (×8): qty 1

## 2018-02-17 MED ORDER — AMOXICILLIN-POT CLAVULANATE 500-125 MG PO TABS
1.0000 | ORAL_TABLET | Freq: Two times a day (BID) | ORAL | Status: DC
  Administered 2018-02-17 – 2018-02-18 (×2): 500 mg via ORAL
  Filled 2018-02-17 (×2): qty 1

## 2018-02-17 MED ORDER — SODIUM CHLORIDE 0.9 % IV BOLUS
500.0000 mL | Freq: Once | INTRAVENOUS | Status: AC
  Administered 2018-02-17: 500 mL via INTRAVENOUS

## 2018-02-17 MED ORDER — LEVOTHYROXINE SODIUM 112 MCG PO TABS
112.0000 ug | ORAL_TABLET | Freq: Every day | ORAL | Status: DC
  Administered 2018-02-18 – 2018-02-20 (×3): 112 ug via ORAL
  Filled 2018-02-17 (×3): qty 1

## 2018-02-17 MED ORDER — ENSURE ENLIVE PO LIQD
237.0000 mL | Freq: Two times a day (BID) | ORAL | Status: DC
  Administered 2018-02-19 – 2018-02-21 (×4): 237 mL via ORAL

## 2018-02-17 MED ORDER — SCOPOLAMINE 1 MG/3DAYS TD PT72
1.0000 | MEDICATED_PATCH | TRANSDERMAL | Status: DC
  Administered 2018-02-17 – 2018-02-19 (×2): 1.5 mg via TRANSDERMAL
  Filled 2018-02-17 (×3): qty 1

## 2018-02-17 MED ORDER — PANTOPRAZOLE SODIUM 40 MG PO TBEC
40.0000 mg | DELAYED_RELEASE_TABLET | Freq: Every day | ORAL | Status: DC
  Administered 2018-02-17 – 2018-02-21 (×5): 40 mg via ORAL
  Filled 2018-02-17 (×6): qty 1

## 2018-02-17 MED ORDER — ASPIRIN EC 81 MG PO TBEC
81.0000 mg | DELAYED_RELEASE_TABLET | Freq: Every day | ORAL | Status: DC
  Administered 2018-02-17 – 2018-02-21 (×5): 81 mg via ORAL
  Filled 2018-02-17 (×5): qty 1

## 2018-02-17 MED ORDER — AMLODIPINE BESYLATE 10 MG PO TABS
10.0000 mg | ORAL_TABLET | Freq: Every day | ORAL | Status: DC
  Administered 2018-02-18 – 2018-02-21 (×4): 10 mg via ORAL
  Filled 2018-02-17 (×4): qty 1

## 2018-02-17 MED ORDER — METOCLOPRAMIDE HCL 5 MG/ML IJ SOLN
10.0000 mg | Freq: Once | INTRAMUSCULAR | Status: AC
  Administered 2018-02-17: 10 mg via INTRAVENOUS
  Filled 2018-02-17: qty 2

## 2018-02-17 MED ORDER — CLOPIDOGREL BISULFATE 75 MG PO TABS
75.0000 mg | ORAL_TABLET | Freq: Every day | ORAL | Status: DC
  Administered 2018-02-17 – 2018-02-21 (×5): 75 mg via ORAL
  Filled 2018-02-17 (×5): qty 1

## 2018-02-17 MED ORDER — DIPHENHYDRAMINE HCL 25 MG PO CAPS
25.0000 mg | ORAL_CAPSULE | Freq: Once | ORAL | Status: AC
  Administered 2018-02-17: 25 mg via ORAL
  Filled 2018-02-17: qty 1

## 2018-02-17 MED ORDER — AMLODIPINE BESYLATE 5 MG PO TABS
10.0000 mg | ORAL_TABLET | Freq: Once | ORAL | Status: AC
  Administered 2018-02-17: 10 mg via ORAL
  Filled 2018-02-17: qty 2

## 2018-02-17 NOTE — ED Provider Notes (Signed)
[facility] EMERGENCY DEPARTMENT Provider Note   CSN: [id number] Arrival date & time: 02/17/18  1000     History   Chief Complaint Chief Complaint  Patient presents with  . Nausea    HPI Brooke Walker is a 84 y.o. female.  Patient is a 84 year old female who presents with nausea.  She has a history of hypertension, hyperlipidemia, chronic kidney disease, coronary artery disease, prior adrenal cancer status post treatment in 1986.  She had radiation which resulted in atrophy of the right kidney.  She has had chronic nausea for about 2 to 3 years.  She has had multiple GI studies have not found the etiology.  She did have some stents placed in her mesenteric arteries which she said improved her symptoms a little bit but she still has ongoing nausea.  She had a recent bout with pyelonephritis and had a stent placed in her knee which is subsequently been removed.  She is still on antibiotics for she states a month.  She is taking on Augmentin.  She states occasionally her nausea gets worse to the point where she is vomited and that has happened since yesterday.  She is had multiple episodes of vomiting.  No diarrhea.  She is having normal bowel movements.  No urinary symptoms.  She has a little bit of ongoing "kidney pain" but denies any marked worsening since her last episode.  She denies any fevers.  No abdominal pain.     Past Medical History:  Diagnosis Date  . Adrenal cancer, right ([place]) 1996  . AKI (acute kidney injury) ([place])   . Anemia   . Arthritis   . Atherosclerosis   . Atrophy of right kidney   . Cardiomegaly 11/03/2017   noted on CXR  . Carotid stenosis, bilateral    Moderate 60-79%  . CKD (chronic kidney disease)    stage 2-3  . Coronary artery disease   . Diverticular disease   . Duodenal obstruction 05/11/2017  . History of colon polyps   . History of goiter   . History of pancreatitis   . Hydronephrosis of right kidney   . Hyperlipidemia   .  Hypertension   . Hypothyroidism   . LVH (left ventricular hypertrophy)    Mild  . Mass of left kidney 12/20/2017   mixed cystic and solid enhancing mass in the midpole left kidney, noted on CT  . Mesenteric ischemia ([place])   . Mild bibasilar atelectasis 12/2017  . PAD (peripheral artery disease) ([place])   . Pancreas cyst 12/20/2017   cysts in the tail of the pancreas noted on CT  . Pericardial effusion    Trivial  . Peripheral polyneuropathy   . Pneumonia   . Psoriasis    Scalp  . Pyelonephritis   . Renal insufficiency   . Restless leg syndrome   . Skin cancer    squamous  . SVT (supraventricular tachycardia) ([place])   . Transient ischemic attack (TIA)   . Wears glasses     Patient Active Problem List   Diagnosis Date Noted  . Psoriasis 12/27/2017  . CKD (chronic kidney disease) stage 3, GFR 30-59 ml/min (HCC) 12/27/2017  . Hyperlipidemia 12/27/2017  . Acquired hypothyroidism 12/27/2017  . Anemia of chronic disease 12/27/2017  . Pyelonephritis 12/20/2017  . Hyperglycemia 12/20/2017  . Essential hypertension 12/20/2017  . Left renal mass 12/20/2017  . Hydronephrosis of right kidney 12/20/2017  . Arrhythmia 12/20/2017  . Pancreatic cyst 12/20/2017    Past  Surgical History:  Procedure Laterality Date  . ABDOMINAL HYSTERECTOMY  1975   excessive bleeding  . ADRENALECTOMY Right    with radiation  . APPENDECTOMY    . CHOLECYSTECTOMY    . COLONOSCOPY    . CYSTOSCOPY WITH RETROGRADE PYELOGRAM, URETEROSCOPY AND STENT PLACEMENT Right 12/22/2017   Procedure: CYSTOSCOPY WITH RETROGRADE PYELOGRAM, AND STENT PLACEMENT;  Surgeon: Kathie Rhodes, MD;  Location: [facility];  Service: Urology;  Laterality: Right;  . CYSTOSCOPY WITH URETEROSCOPY AND STENT PLACEMENT Right 01/22/2018   Procedure: CYSTOSCOPY WITH URETEROSCOPY / STENT REMOVAL/ BIOPSY AND STENT PLACEMENT;  Surgeon: Kathie Rhodes, MD;  Location: [facility];  Service: Urology;  Laterality: Right;  . Left cutdown   11/15/2016  . mesenteric ischemia stents  04/12/2017   x2  . RENAL ARTERY STENT Left 04/12/2017  . THYROIDECTOMY    . UPPER GI ENDOSCOPY    . URETERAL STENT PLACEMENT       OB History   None      Home Medications    Prior to Admission medications   Medication Sig Start Date End Date Taking? Authorizing Provider  amLODipine (NORVASC) 10 MG tablet Take 1 tablet (10 mg total) by mouth daily. 12/25/17   Kayleen Memos, DO  Apremilast (OTEZLA PO) Take 30 mg by mouth daily.     [provider]  aspirin EC 81 MG tablet Take by mouth.    [provider]  clopidogrel (PLAVIX) 75 MG tablet TAKE 1 TABLET BY MOUTH EVERY DAY 08/04/17   [provider]  diphenhydrAMINE (BENADRYL) 25 MG tablet Take 25 mg by mouth at bedtime.    [provider]  docusate sodium (COLACE) 100 MG capsule Take 100 mg by mouth 2 (two) times daily.    [provider]  estradiol (ESTRACE) 0.1 MG/GM vaginal cream Apply a pea-sized amount within vagina 2-3 times per week 08/04/16   [provider]  ferrous sulfate 325 (65 FE) MG tablet Take 1 tablet (325 mg total) by mouth 2 (two) times daily with a meal. Patient not taking: Reported on 02/15/2018 12/25/17   Kayleen Memos, DO  hydrALAZINE (APRESOLINE) 100 MG tablet Take 1 tablet (100 mg total) by mouth 3 (three) times daily. Patient taking differently: Take 50 mg by mouth 2 (two) times daily.  12/25/17   Kayleen Memos, DO  hydrochlorothiazide (HYDRODIURIL) 25 MG tablet Take 1 tablet (25 mg total) by mouth daily. 12/25/17   Kayleen Memos, DO  ketoconazole (NIZORAL) 2 % shampoo Apply 1 application topically 3 (three) times a week. Use three times weekly for two weeks. 11/15/17   Quintella Reichert, MD  levothyroxine (SYNTHROID, LEVOTHROID) 112 MCG tablet TAKE 1 TABLET (112 MCG TOTAL) BY MOUTH DAILY AT 6AM 09/18/17   [provider]  pantoprazole (PROTONIX) 40 MG tablet Take 40 mg by mouth 2 (two) times daily.    [provider]  polyethylene glycol (MIRALAX / GLYCOLAX) packet Take 17 g by mouth daily. Patient taking differently: Take 17 g by mouth daily as needed.  12/25/17   Kayleen Memos, DO  pravastatin (PRAVACHOL) 40 MG tablet daily.  08/15/12   [provider]    Family History Family History  Problem Relation Age of Onset  . Colon cancer Mother   . Prostate cancer Father   . Heart attack Sister   . Heart disease Brother   . Heart disease Brother     Social History Social History   Tobacco  Use  . Smoking status: Never Smoker  . Smokeless tobacco: Never Used  Substance Use Topics  . Alcohol use: Never    Frequency: Never  . Drug use: Never     Allergies   Amoxicillin; Clonidine; Sulfa antibiotics; Sulfamethoxazole; Clonidine derivatives; and Metoprolol   Review of Systems Review of Systems  Constitutional: Positive for fatigue. Negative for chills, diaphoresis and fever.  HENT: Negative for congestion, rhinorrhea and sneezing.   Eyes: Negative.   Respiratory: Negative for cough, chest tightness and shortness of breath.   Cardiovascular: Negative for chest pain and leg swelling.  Gastrointestinal: Positive for nausea and vomiting. Negative for abdominal pain, blood in stool and diarrhea.  Genitourinary: Negative for difficulty urinating, flank pain, frequency and hematuria.  Musculoskeletal: Positive for back pain. Negative for arthralgias.  Skin: Negative for rash.  Neurological: Negative for dizziness, speech difficulty, weakness, numbness and headaches.     Physical Exam Updated Vital Signs BP (!) 190/70   Pulse (!) 40   Temp 97.7 F (36.5 C) (Oral)   Resp 17   Ht 5\' 4"  (1.626 m)   Wt 51.7 kg   SpO2 97%   BMI 19.57 kg/m   Physical Exam  Constitutional: She is oriented to person, place, and time. She appears well-developed and well-nourished.  HENT:  Head: Normocephalic and atraumatic.  Eyes: Pupils are equal, round, and reactive to light.  Neck:  Normal range of motion. Neck supple.  Cardiovascular: Normal rate, regular rhythm and normal heart sounds.  Pulmonary/Chest: Effort normal and breath sounds normal. No respiratory distress. She has no wheezes. She has no rales. She exhibits no tenderness.  Abdominal: Soft. Bowel sounds are normal. There is no tenderness. There is no rebound and no guarding.  Musculoskeletal: Normal range of motion. She exhibits no edema.  Lymphadenopathy:    She has no cervical adenopathy.  Neurological: She is alert and oriented to person, place, and time.  Skin: Skin is warm and dry. No rash noted.  Psychiatric: She has a normal mood and affect.     ED Treatments / Results  Labs (all labs ordered are listed, but only abnormal results are displayed) Labs Reviewed  COMPREHENSIVE METABOLIC PANEL - Abnormal; Notable for the following components:      Result Value   Glucose, Bld 108 (*)    Creatinine, Ser 1.01 (*)    AST 242 (*)    ALT 239 (*)    Alkaline Phosphatase 195 (*)    GFR calc non Af Amer 49 (*)    GFR calc Af Amer 57 (*)    All other components within normal limits  CBC - Abnormal; Notable for the following components:   RBC 3.85 (*)    Hemoglobin 11.9 (*)    All other components within normal limits  URINALYSIS, ROUTINE W REFLEX MICROSCOPIC - Abnormal; Notable for the following components:   Bilirubin Urine SMALL (*)    Ketones, ur 15 (*)    Protein, ur 100 (*)    All other components within normal limits  URINALYSIS, MICROSCOPIC (REFLEX) - Abnormal; Notable for the following components:   Bacteria, UA MANY (*)    All other components within normal limits  LIPASE, BLOOD  HEPATITIS PANEL, ACUTE    EKG EKG Interpretation  Date/Time:  Saturday February 17 2018 12:29:16 EDT Ventricular Rate:  104 PR Interval:    QRS Duration: 79 QT Interval:  347 QTC Calculation: 457 R Axis:   72 Text Interpretation:  Fast sinus arrhythmia  Borderline repolarization abnormality Confirmed by  Malvin Johns [phone]) on 02/17/2018 12:47:11 PM   Radiology Ct Renal Stone Study  Result Date: 02/17/2018 CLINICAL DATA:  Nausea for the past 2 years, worsening. Vomiting this morning. EXAM: CT ABDOMEN AND PELVIS WITHOUT CONTRAST TECHNIQUE: Multidetector CT imaging of the abdomen and pelvis was performed following the standard protocol without IV contrast. COMPARISON:  12/20/2017.  Abdomen MRI dated 07/17/2017. FINDINGS: Lower chest: Stable mild scarring at the left lung base. Small to moderate sized pericardial effusion with an interval increase in size, currently measuring 11 mm in thickness and 6 Hounsfield units in density. Hepatobiliary: No focal liver abnormality is seen. Status post cholecystectomy. No biliary dilatation. Pancreas: Diffusely atrophied. The previously demonstrated 2.6 x 2.2 cm pancreatic tail mass currently measures 2.6 x 2.3 cm on image number 29 series 2. The previously demonstrated 2nd, smaller pancreatic tail mass is not clearly visible today without intravenous contrast. Spleen: Normal in size without focal abnormality. Adrenals/Urinary Tract: Stable severely atrophied right kidney with marked hydronephrosis. The previously demonstrated 1.8 cm anterior left renal mass is not well visualized without intravenous contrast, grossly unchanged. Unremarkable bladder and ureters. Stomach/Bowel: Mild sigmoid colon diverticulosis. Surgically absent appendix. Unremarkable stomach and small bowel. Vascular/Lymphatic: Atheromatous arterial calcifications without aneurysm. No enlarged lymph nodes. Celiac axis, superior mesenteric and left renal artery stents. Both the celiac axis and SMA stents have a crimped appearance, with marked narrowing of the SMA stent on the sagittal images and probable moderate narrowing of the celiac axis stent. Reproductive: Status post hysterectomy. No adnexal masses. Other: No abdominal wall hernia or abnormality. No abdominopelvic ascites. Upper abdominal surgical  clips on the right. Musculoskeletal: Moderate levoconvex thoracolumbar scoliosis and degenerative changes. IMPRESSION: 1. No acute abnormality. 2. Small to moderate-sized pericardial effusion with an interval increase in size. 3. Stable pancreatic tail cyst, currently measuring 2.6 x 2.3 cm. 4. 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 5. Grossly stable left renal mass, incompletely evaluated without intravenous contrast. 6. Crimped appearance of the celiac axis and SMA stents with luminal narrowing, as described above. Electronically Signed   By: Claudie Revering M.D.   On: 02/17/2018 11:24    Procedures Procedures (including critical care time)  Medications Ordered in ED Medications  sodium chloride 0.9 % bolus 500 mL (500 mLs Intravenous New Bag/Given 02/17/18 1247)  sodium chloride 0.9 % bolus 500 mL (0 mLs Intravenous Stopped 02/17/18 1226)  metoCLOPramide (REGLAN) injection 10 mg (10 mg Intravenous Given 02/17/18 1123)  amLODipine (NORVASC) tablet 10 mg (10 mg Oral Given 02/17/18 1239)     Initial Impression / Assessment and Plan / ED Course  I have reviewed the triage vital signs and the nursing notes.  Pertinent labs & imaging results that were available during my care of the patient were reviewed by me and considered in my medical decision making (see chart for details).     Patient is a 84 year old female who presents with nausea vomiting.  Her abdominal exam is fairly benign but given her recent history, I did do a CT scan which does not show any acute disease.  There is some cramping of her mesenteric artery stents.  There is some chronic appearing right hydronephrosis.  She does not have infected urine.  She does have evidence of hepatitis on her labs.  There is no pancreatitis.  She is status post cholecystectomy.  She denies any Tylenol usage.  Hepatitis panel was sent.  She  has had previous hepatitis related to taking  amoxicillin and she is currently on Augmentin.  This may be a questionable etiology.  She has ongoing tachycardia and some ongoing nausea after treatment in the ED.  I have spoken to Dr. [place] Pink who actually is at [facility] but is excepting the patient for admission to [facility] per pt request.  Final Clinical Impressions(s) / ED Diagnoses   Final diagnoses:  Intractable vomiting with nausea, unspecified vomiting type  Hepatitis    ED Discharge Orders    None       Malvin Johns, MD 02/17/18 1252

## 2018-02-17 NOTE — ED Notes (Signed)
Report given to [place], receiving nurse at [facility]

## 2018-02-17 NOTE — Plan of Care (Signed)
Patient admitted to [facility] from [place], VSS, BP mildly elevated on admission, home meds resumed.  Patient has had no complaints of nausea or vomiting since arrival to unit.  Son and daughter in law at bedside.  Records obtained from gastroenterology at [facility] and placed on chart.

## 2018-02-17 NOTE — ED Notes (Signed)
[facility] arrived to transport pt to [facility].  

## 2018-02-17 NOTE — ED Notes (Signed)
ED Provider at bedside. 

## 2018-02-17 NOTE — H&P (Signed)
History and Physical    Brooke Walker ACZ:[id number] DOB: August 30, 1930 DOA: 02/17/2018  PCP: Caren Macadam, MD Patient coming from: [facility]  Chief Complaint: Nausea for 2 to 3 years HPI: Brooke Walker is a 84 y.o. female with medical history significant of adrenal cancer, right kidney stent x2 which was removed, mesenteric artery stenting x2, Was transferred from [facility] with complaints of intractable nausea and vomiting decreased appetite and weight loss.  Patient reports she lost 15 pounds in the last 6 weeks.  She absolutely has no appetite.  She denies any change in bowel habits no constipation diarrhea, no melena no hematuria hematochezia no fever no chills no cough no chest pain no shortness of breath no abdominal pain no urinary complaints no burning urination no fever or chills.  Reports that she sees Dr. Jerene Pitch at [facility] and he did EGD and colonoscopy which apparently was said to be normal per patient.  She has decided to come to [facility] because [place] far for her and she wants further medical care and [place].  She reports that she has had EGD and colonoscopy with no findings that is abnormal, she was told she has pancreatic cyst that was biopsied x2 which was benign, she has a history of diverticulitis and was hospitalized in 2016.  She reports nausea does not get better with Zofran.  Denies any shortness of breath cough lower extremity edema, no dizziness no lightheadedness.  Patient has a son in the area. Been on antibiotics on and off for few weeks due to kidney infections followed at  [facility] urology. She denies any headache, changes with her vision, changes with smell.  ED Course: Patient transferred from [facility] where she received Reglan and when I saw her she want to eat and her nausea was better.  Review of Systems: As per HPI otherwise all other systems reviewed and are negative  Ambulatory Status she  is ambulatory at baseline and takes care of all ADLs by herself.  Past Medical History:  Diagnosis Date  . Adrenal cancer, right ([place]) 1996  . AKI (acute kidney injury) ([place])   . Anemia   . Arthritis   . Atherosclerosis   . Atrophy of right kidney   . Cardiomegaly 11/03/2017   noted on CXR  . Carotid stenosis, bilateral    Moderate 60-79%  . CKD (chronic kidney disease)    stage 2-3  . Coronary artery disease   . Diverticular disease   . Duodenal obstruction 05/11/2017  . History of colon polyps   . History of goiter   . History of pancreatitis   . Hydronephrosis of right kidney   . Hyperlipidemia   . Hypertension   . Hypothyroidism   . LVH (left ventricular hypertrophy)    Mild  . Mass of left kidney 12/20/2017   mixed cystic and solid enhancing mass in the midpole left kidney, noted on CT  . Mesenteric ischemia ([place])   . Mild bibasilar atelectasis 12/2017  . PAD (peripheral artery disease) ([place])   . Pancreas cyst 12/20/2017   cysts in the tail of the pancreas noted on CT  . Pericardial effusion    Trivial  . Peripheral polyneuropathy   . Pneumonia   . Psoriasis    Scalp  . Pyelonephritis   . Renal insufficiency   . Restless leg syndrome   . Skin cancer    squamous  . SVT (supraventricular tachycardia) ([place])   .  Transient ischemic attack (TIA)   . Wears glasses     Past Surgical History:  Procedure Laterality Date  . ABDOMINAL HYSTERECTOMY  1975   excessive bleeding  . ADRENALECTOMY Right    with radiation  . APPENDECTOMY    . CHOLECYSTECTOMY    . COLONOSCOPY    . CYSTOSCOPY WITH RETROGRADE PYELOGRAM, URETEROSCOPY AND STENT PLACEMENT Right 12/22/2017   Procedure: CYSTOSCOPY WITH RETROGRADE PYELOGRAM, AND STENT PLACEMENT;  Surgeon: Kathie Rhodes, MD;  Location: [facility];  Service: Urology;  Laterality: Right;  . CYSTOSCOPY WITH URETEROSCOPY AND STENT PLACEMENT Right 01/22/2018   Procedure: CYSTOSCOPY WITH URETEROSCOPY / STENT REMOVAL/ BIOPSY AND STENT  PLACEMENT;  Surgeon: Kathie Rhodes, MD;  Location: [facility];  Service: Urology;  Laterality: Right;  . Left cutdown  11/15/2016  . mesenteric ischemia stents  04/12/2017   x2  . RENAL ARTERY STENT Left 04/12/2017  . THYROIDECTOMY    . UPPER GI ENDOSCOPY    . URETERAL STENT PLACEMENT      Social History   Socioeconomic History  . Marital status: Widowed    Spouse name: Not on file  . Number of children: Not on file  . Years of education: Not on file  . Highest education level: Not on file  Occupational History  . Occupation: retired  Scientific laboratory technician  . Financial resource strain: Not on file  . Food insecurity:    Worry: Not on file    Inability: Not on file  . Transportation needs:    Medical: Not on file    Non-medical: Not on file  Tobacco Use  . Smoking status: Never Smoker  . Smokeless tobacco: Never Used  Substance and Sexual Activity  . Alcohol use: Never    Frequency: Never  . Drug use: Never  . Sexual activity: Not on file  Lifestyle  . Physical activity:    Days per week: Not on file    Minutes per session: Not on file  . Stress: Not on file  Relationships  . Social connections:    Talks on phone: Not on file    Gets together: Not on file    Attends religious service: Not on file    Active member of club or organization: Not on file    Attends meetings of clubs or organizations: Not on file    Relationship status: Not on file  . Intimate partner violence:    Fear of current or ex partner: Not on file    Emotionally abused: Not on file    Physically abused: Not on file    Forced sexual activity: Not on file  Other Topics Concern  . Not on file  Social History Narrative  . Not on file    Allergies  Allergen Reactions  . Amoxicillin     Other reaction(s): Other (See Comments) Liver infection per pt report Caused liver infection   . Clonidine Anaphylaxis  . Sulfa Antibiotics Nausea Only  . Sulfamethoxazole Nausea And Vomiting  .  Metoprolol     Other reaction(s): Dizziness (intolerance) syncope    Family History  Problem Relation Age of Onset  . Colon cancer Mother   . Prostate cancer Father   . Heart attack Sister   . Heart disease Brother   . Heart disease Brother      Prior to Admission medications   Medication Sig Start Date End Date Taking? Authorizing Provider  amLODipine (NORVASC) 10 MG tablet Take 1 tablet (10 mg total) by  mouth daily. 12/25/17  Yes Irene Pap N, DO  amoxicillin-clavulanate (AUGMENTIN) 500-125 MG tablet Take 1 tablet by mouth every 12 (twelve) hours. 02/12/18  Yes [provider]  Apremilast (OTEZLA PO) Take 30 mg by mouth daily.    Yes [provider]  aspirin EC 81 MG tablet Take by mouth.   Yes [provider]  carvedilol (COREG) 25 MG tablet Take 25 mg by mouth 2 (two) times daily with a meal. 02/05/18  Yes [provider]  clopidogrel (PLAVIX) 75 MG tablet TAKE 1 TABLET BY MOUTH EVERY DAY 08/04/17  Yes [provider]  diphenhydrAMINE (BENADRYL) 25 MG tablet Take 25 mg by mouth at bedtime.   Yes [provider]  docusate sodium (COLACE) 100 MG capsule Take 100 mg by mouth as needed for mild constipation.    Yes [provider]  estradiol (ESTRACE) 0.1 MG/GM vaginal cream Place 1 Applicatorful vaginally 3 (three) times a week.  08/04/16  Yes [provider]  hydrALAZINE (APRESOLINE) 100 MG tablet Take 1 tablet (100 mg total) by mouth 3 (three) times daily. Patient taking differently: Take 50 mg by mouth 2 (two) times daily.  12/25/17  Yes Kayleen Memos, DO  hydrochlorothiazide (HYDRODIURIL) 25 MG tablet Take 1 tablet (25 mg total) by mouth daily. 12/25/17  Yes Hall, Carole N, DO  ketoconazole (NIZORAL) 2 % shampoo Apply 1 application topically 3 (three) times a week. Use three times weekly for two weeks. 11/15/17  Yes Quintella Reichert, MD  levothyroxine (SYNTHROID, LEVOTHROID) 112 MCG tablet Take 112 mcg by mouth daily  before breakfast.  09/18/17  Yes [provider]  ondansetron (ZOFRAN) 8 MG tablet Take 8 mg by mouth as needed for nausea/vomiting. 01/31/18  Yes [provider]  polyethylene glycol (MIRALAX / GLYCOLAX) packet Take 17 g by mouth daily. Patient taking differently: Take 17 g by mouth daily as needed for mild constipation.  12/25/17  Yes Hall, Carole N, DO  pravastatin (PRAVACHOL) 40 MG tablet Take 40 mg by mouth daily.  08/15/12  Yes [provider]  ferrous sulfate 325 (65 FE) MG tablet Take 1 tablet (325 mg total) by mouth 2 (two) times daily with a meal. Patient not taking: Reported on 02/17/2018 12/25/17   Kayleen Memos, DO    Physical Exam: Vitals:   02/17/18 1230 02/17/18 1300 02/17/18 1343 02/17/18 1519  BP: (!) 190/70 (!) 185/76 (!) 179/88 (!) 182/56  Pulse: (!) 40 93 95 85  Resp: 17 15 18 16   Temp:   98.3 F (36.8 C) 98.2 F (36.8 C)  TempSrc:   Oral Oral  SpO2: 97% 96% 95% 96%  Weight:    50.1 kg  Height:    5\' 4"  (1.626 m)     General:  Appears calm and comfortable Eyes:  PERRL, EOMI, normal lids, iris ENT:  grossly normal hearing, lips & tongue, oral mucosa dry Neck:  no LAD, masses or thyromegaly Cardiovascular:  RRR, no m/r/g. No LE edema.  Respiratory:  CTA bilaterally, no w/r/r. Normal respiratory effort. Abdomen:  soft, ntnd, NABS Skin: no rash or induration seen on limited exam Musculoskeletal:  grossly normal tone BUE/BLE, good ROM, no bony abnormality Psychiatric:  grossly normal mood and affect, speech fluent and appropriate, AOx3 Neurologic:  CN 2-12 grossly intact, moves all extremities in coordinated fashion, sensation intact  Labs on Admission: I have personally reviewed following labs and imaging studies  CBC: Recent Labs  Lab 02/17/18 1041  WBC 7.5  HGB 11.9*  HCT 36.5  MCV 94.8  PLT 903   Basic Metabolic Panel: Recent Labs  Lab 02/17/18 1041  NA 136  K 4.2  CL 100  CO2 24  GLUCOSE 108*  BUN 20  CREATININE 1.01*   CALCIUM 9.3   GFR: Estimated Creatinine Clearance: 32.2 mL/min (A) (by C-G formula based on SCr of 1.01 mg/dL (H)). Liver Function Tests: Recent Labs  Lab 02/17/18 1041  AST 242*  ALT 239*  ALKPHOS 195*  BILITOT 0.6  PROT 7.0  ALBUMIN 3.7   Recent Labs  Lab 02/17/18 1041  LIPASE 23   No results for input(s): AMMONIA in the last 168 hours. Coagulation Profile: No results for input(s): INR, PROTIME in the last 168 hours. Cardiac Enzymes: No results for input(s): CKTOTAL, CKMB, CKMBINDEX, TROPONINI in the last 168 hours. BNP (last 3 results) No results for input(s): PROBNP in the last 8760 hours. HbA1C: No results for input(s): HGBA1C in the last 72 hours. CBG: No results for input(s): GLUCAP in the last 168 hours. Lipid Profile: No results for input(s): CHOL, HDL, LDLCALC, TRIG, CHOLHDL, LDLDIRECT in the last 72 hours. Thyroid Function Tests: No results for input(s): TSH, T4TOTAL, FREET4, T3FREE, THYROIDAB in the last 72 hours. Anemia Panel: No results for input(s): VITAMINB12, FOLATE, FERRITIN, TIBC, IRON, RETICCTPCT in the last 72 hours. Urine analysis:    Component Value Date/Time   COLORURINE YELLOW 02/17/2018 1024   APPEARANCEUR CLEAR 02/17/2018 1024   LABSPEC 1.025 02/17/2018 1024   PHURINE 6.0 02/17/2018 1024   GLUCOSEU NEGATIVE 02/17/2018 1024   HGBUR NEGATIVE 02/17/2018 1024   BILIRUBINUR SMALL (A) 02/17/2018 1024   KETONESUR 15 (A) 02/17/2018 1024   PROTEINUR 100 (A) 02/17/2018 1024   NITRITE NEGATIVE 02/17/2018 1024   LEUKOCYTESUR NEGATIVE 02/17/2018 1024    Creatinine Clearance: Estimated Creatinine Clearance: 32.2 mL/min (A) (by C-G formula based on SCr of 1.01 mg/dL (H)).  Sepsis Labs: @LABRCNTIP (procalcitonin:4,lacticidven:4) )No results found for this or any previous visit (from the past 240 hour(s)).   Radiological Exams on Admission: Ct Renal Stone Study  Result Date: 02/17/2018 CLINICAL DATA:  Nausea for the past 2 years, worsening.  Vomiting this morning. EXAM: CT ABDOMEN AND PELVIS WITHOUT CONTRAST TECHNIQUE: Multidetector CT imaging of the abdomen and pelvis was performed following the standard protocol without IV contrast. COMPARISON:  12/20/2017.  Abdomen MRI dated 07/17/2017. FINDINGS: Lower chest: Stable mild scarring at the left lung base. Small to moderate sized pericardial effusion with an interval increase in size, currently measuring 11 mm in thickness and 6 Hounsfield units in density. Hepatobiliary: No focal liver abnormality is seen. Status post cholecystectomy. No biliary dilatation. Pancreas: Diffusely atrophied. The previously demonstrated 2.6 x 2.2 cm pancreatic tail mass currently measures 2.6 x 2.3 cm on image number 29 series 2. The previously demonstrated 2nd, smaller pancreatic tail mass is not clearly visible today without intravenous contrast. Spleen: Normal in size without focal abnormality. Adrenals/Urinary Tract: Stable severely atrophied right kidney with marked hydronephrosis. The previously demonstrated 1.8 cm anterior left renal mass is not well visualized without intravenous contrast, grossly unchanged. Unremarkable bladder and ureters. Stomach/Bowel: Mild sigmoid colon diverticulosis. Surgically absent appendix. Unremarkable stomach and small bowel. Vascular/Lymphatic: Atheromatous arterial calcifications without aneurysm. No enlarged lymph nodes. Celiac axis, superior mesenteric and left renal artery stents. Both the celiac axis and SMA stents have a crimped appearance, with marked narrowing of the SMA stent on the sagittal images and probable moderate narrowing of the celiac axis stent. Reproductive:  Status post hysterectomy. No adnexal masses. Other: No abdominal wall hernia or abnormality. No abdominopelvic ascites. Upper abdominal surgical clips on the right. Musculoskeletal: Moderate levoconvex thoracolumbar scoliosis and degenerative changes. IMPRESSION: 1. No acute abnormality. 2. Small to  moderate-sized pericardial effusion with an interval increase in size. 3. Stable pancreatic tail cyst, currently measuring 2.6 x 2.3 cm. 4. 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 5. Grossly stable left renal mass, incompletely evaluated without intravenous contrast. 6. Crimped appearance of the celiac axis and SMA stents with luminal narrowing, as described above. Electronically Signed   By: Claudie Revering M.D.   On: 02/17/2018 11:24    EKG: Independently reviewed.   Assessment/Plan Active Problems:   Abnormal LFTs   Intractable nausea and vomiting   #1 patient admitted with intractable nausea and vomiting-it appears that patient had multiple work-up done in the past for the same.  She reports that she has a history of peptic ulcer disease, diverticulitis, mesenteric artery occlusion status post stent x2.  She denies any abdominal pain.  I will start her on a diet and see how she does and place her on PRN antinausea agents.  Will consult on-call GI.  Obtain reports from [facility] Dr. Jerene Pitch.  I will start her on Protonix, scopolamine patch for nausea, hepatitis panel will be sent.  CT of the abdomen and pelvis done in the ER showed small to moderate sized pericardial effusion with an interval increase in size, stable pancreatic tail cyst 2.6 x 2.3 cm, severely atrophied right kidney with marketed hydronephrosis most likely from chronic UPJ obstruction, grossly stable left renal mass, crimped appearance of the celiac axis and SMA stents with luminal narrowing.  I will check an echocardiogram in view of her pericardial effusion by CT scan.  #2 hypertension restart all the home medications  #3 recurrent UTI pyelonephritis on Augmentin for a total of 30 days.     DVT prophylaxis: Lovenox  Code Status: DO NOT RESUSCITATE Family Communication: None Disposition Plan: TBD Consults called: GI Admission status:  Observation   Georgette Shell MD [facility]  If 7PM-7AM, please contact night-coverage [email or web address] Password [email or web address]  02/17/2018, 4:08 PM

## 2018-02-17 NOTE — ED Notes (Signed)
Patient transported to CT 

## 2018-02-17 NOTE — ED Triage Notes (Signed)
Presents with n/v that began at 9 pm yesterday. She threw up once at that time and reports it was her lunch from earlier th was undigested. All through the evening she felt bad and it continued into this AM. She denies pain. Denies diarrhea. Denies dysuria. Denies SOB.

## 2018-02-17 NOTE — ED Notes (Signed)
Family at bedside. 

## 2018-02-17 NOTE — ED Notes (Signed)
Report given to Michael RN with [facility] 

## 2018-02-18 ENCOUNTER — Observation Stay (HOSPITAL_COMMUNITY): Payer: Medicare Other

## 2018-02-18 ENCOUNTER — Observation Stay (HOSPITAL_BASED_OUTPATIENT_CLINIC_OR_DEPARTMENT_OTHER): Payer: Medicare Other

## 2018-02-18 DIAGNOSIS — D638 Anemia in other chronic diseases classified elsewhere: Secondary | ICD-10-CM | POA: Diagnosis present

## 2018-02-18 DIAGNOSIS — I313 Pericardial effusion (noninflammatory): Secondary | ICD-10-CM | POA: Diagnosis present

## 2018-02-18 DIAGNOSIS — R946 Abnormal results of thyroid function studies: Secondary | ICD-10-CM | POA: Diagnosis present

## 2018-02-18 DIAGNOSIS — B179 Acute viral hepatitis, unspecified: Secondary | ICD-10-CM | POA: Diagnosis present

## 2018-02-18 DIAGNOSIS — I739 Peripheral vascular disease, unspecified: Secondary | ICD-10-CM | POA: Diagnosis present

## 2018-02-18 DIAGNOSIS — T360X5A Adverse effect of penicillins, initial encounter: Secondary | ICD-10-CM | POA: Diagnosis present

## 2018-02-18 DIAGNOSIS — Z681 Body mass index (BMI) 19 or less, adult: Secondary | ICD-10-CM | POA: Diagnosis not present

## 2018-02-18 DIAGNOSIS — G2581 Restless legs syndrome: Secondary | ICD-10-CM | POA: Diagnosis present

## 2018-02-18 DIAGNOSIS — K219 Gastro-esophageal reflux disease without esophagitis: Secondary | ICD-10-CM | POA: Diagnosis present

## 2018-02-18 DIAGNOSIS — I129 Hypertensive chronic kidney disease with stage 1 through stage 4 chronic kidney disease, or unspecified chronic kidney disease: Secondary | ICD-10-CM | POA: Diagnosis present

## 2018-02-18 DIAGNOSIS — T361X5A Adverse effect of cephalosporins and other beta-lactam antibiotics, initial encounter: Secondary | ICD-10-CM | POA: Diagnosis present

## 2018-02-18 DIAGNOSIS — K759 Inflammatory liver disease, unspecified: Secondary | ICD-10-CM | POA: Diagnosis not present

## 2018-02-18 DIAGNOSIS — Z66 Do not resuscitate: Secondary | ICD-10-CM | POA: Diagnosis present

## 2018-02-18 DIAGNOSIS — R112 Nausea with vomiting, unspecified: Secondary | ICD-10-CM | POA: Diagnosis present

## 2018-02-18 DIAGNOSIS — I340 Nonrheumatic mitral (valve) insufficiency: Secondary | ICD-10-CM | POA: Diagnosis not present

## 2018-02-18 DIAGNOSIS — R945 Abnormal results of liver function studies: Secondary | ICD-10-CM | POA: Diagnosis not present

## 2018-02-18 DIAGNOSIS — Z90710 Acquired absence of both cervix and uterus: Secondary | ICD-10-CM | POA: Diagnosis not present

## 2018-02-18 DIAGNOSIS — E890 Postprocedural hypothyroidism: Secondary | ICD-10-CM | POA: Diagnosis present

## 2018-02-18 DIAGNOSIS — K862 Cyst of pancreas: Secondary | ICD-10-CM | POA: Diagnosis present

## 2018-02-18 DIAGNOSIS — N136 Pyonephrosis: Secondary | ICD-10-CM | POA: Diagnosis present

## 2018-02-18 DIAGNOSIS — N183 Chronic kidney disease, stage 3 (moderate): Secondary | ICD-10-CM | POA: Diagnosis present

## 2018-02-18 DIAGNOSIS — E785 Hyperlipidemia, unspecified: Secondary | ICD-10-CM | POA: Diagnosis present

## 2018-02-18 DIAGNOSIS — L89312 Pressure ulcer of right buttock, stage 2: Secondary | ICD-10-CM | POA: Diagnosis present

## 2018-02-18 DIAGNOSIS — L409 Psoriasis, unspecified: Secondary | ICD-10-CM | POA: Diagnosis present

## 2018-02-18 DIAGNOSIS — I2510 Atherosclerotic heart disease of native coronary artery without angina pectoris: Secondary | ICD-10-CM | POA: Diagnosis present

## 2018-02-18 DIAGNOSIS — I361 Nonrheumatic tricuspid (valve) insufficiency: Secondary | ICD-10-CM

## 2018-02-18 DIAGNOSIS — E440 Moderate protein-calorie malnutrition: Secondary | ICD-10-CM | POA: Diagnosis present

## 2018-02-18 DIAGNOSIS — L89322 Pressure ulcer of left buttock, stage 2: Secondary | ICD-10-CM | POA: Diagnosis present

## 2018-02-18 DIAGNOSIS — E860 Dehydration: Secondary | ICD-10-CM | POA: Diagnosis present

## 2018-02-18 LAB — COMPREHENSIVE METABOLIC PANEL
ALT: 246 U/L — ABNORMAL HIGH (ref 0–44)
AST: 281 U/L — AB (ref 15–41)
Albumin: 3.4 g/dL — ABNORMAL LOW (ref 3.5–5.0)
Alkaline Phosphatase: 210 U/L — ABNORMAL HIGH (ref 38–126)
Anion gap: 9 (ref 5–15)
BUN: 21 mg/dL (ref 8–23)
CHLORIDE: 105 mmol/L (ref 98–111)
CO2: 26 mmol/L (ref 22–32)
Calcium: 9.1 mg/dL (ref 8.9–10.3)
Creatinine, Ser: 1.03 mg/dL — ABNORMAL HIGH (ref 0.44–1.00)
GFR calc Af Amer: 56 mL/min — ABNORMAL LOW (ref >60)
GFR calc non Af Amer: 48 mL/min — ABNORMAL LOW (ref >60)
Glucose, Bld: 92 mg/dL (ref 70–99)
POTASSIUM: 3.9 mmol/L (ref 3.5–5.1)
Sodium: 140 mmol/L (ref 135–145)
Total Bilirubin: 0.6 mg/dL (ref 0.3–1.2)
Total Protein: 6.1 g/dL — ABNORMAL LOW (ref 6.5–8.1)

## 2018-02-18 LAB — ECHOCARDIOGRAM COMPLETE
HEIGHTINCHES: 64 in
WEIGHTICAEL: 1767 [oz_av]

## 2018-02-18 LAB — HEPATITIS PANEL, ACUTE
HCV Ab: <0.1 s/co ratio (ref 0.0–0.9)
HEP A IGM: NEGATIVE
HEP B C IGM: NEGATIVE
Hepatitis B Surface Ag: NEGATIVE

## 2018-02-18 MED ORDER — HYDROCORTISONE 1 % EX CREA
TOPICAL_CREAM | Freq: Three times a day (TID) | CUTANEOUS | Status: DC | PRN
  Filled 2018-02-18: qty 28

## 2018-02-18 MED ORDER — DIPHENHYDRAMINE HCL 50 MG PO CAPS
50.0000 mg | ORAL_CAPSULE | Freq: Every evening | ORAL | Status: DC | PRN
  Administered 2018-02-18 – 2018-02-20 (×3): 50 mg via ORAL
  Filled 2018-02-18 (×3): qty 1

## 2018-02-18 MED ORDER — SODIUM CHLORIDE 0.45 % IV SOLN
INTRAVENOUS | Status: DC
  Administered 2018-02-18 – 2018-02-19 (×3): via INTRAVENOUS

## 2018-02-18 NOTE — Consult Note (Signed)
Urology Consult Note   Requesting Attending Physician:  Georgette Shell, MD Service Providing Consult: Urology  Consulting Attending: Dr. Tresa Moore   Reason for Consult:  Hydronephrosis  HPI: Brooke Walker is seen in consultation for reasons noted above at the request of Georgette Shell, MD for evaluation of hydronephrosis, prior UTIs.  This is a 84 y.o. female who is known to our practice, previously seen by Dr. Karsten Ro, whose urologic history dates back to August 2019 of this year. She was initially seen as a consultation for right hydronephrosis in the setting of right renal atrophy with pyonephrosis and ultimately had a right ureteral stent placed on 12/21/17. Urine grew pan-sensitive E.coli at that time. She was taken for a formal ureteroscopy on 01/22/18 given concern for possible ureteral stricture vs urothelial lesion given her new hydronephrosis on that side. Based on notes, it seems ureter was patent although with a poorly draining lower pole renal system with residual purulent material. The stent was replaced. She was seen back in clinic on 02/06/18 at which point she was doing well and the stent was removed. A urine culture from that date grew 20K Enterococcus faecalis which was treated with a course of Augmentin which was appropriate based on sensitivities. Unfortunately she was admitted to [facility] recently with intractable nausea/vomiting with low appetite and weight loss. Based on our notes, these symptoms pre-dated her prior stents and hydronephrosis.   She had a CT abd/pelvis 02/17/18:  IMPRESSION: 1. No acute abnormality. 2. Small to moderate-sized pericardial effusion with an interval increase in size. 3. Stable pancreatic tail cyst, currently measuring 2.6 x 2.3 cm. 4. 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 5. Grossly stable left renal mass, incompletely evaluated without intravenous contrast. 6. Crimped  appearance of the celiac axis and SMA stents with luminal narrowing, as described above.  UA 02/17/18: negative nitrites, negative LE, negative pyuria  She is afebrile with normal vitals. Creatine ~1 (here creatinine was 1.1 - 1.4 while right stent was placed in Aug 2019)   Today she describes minimal right flank pain at rest. Nothing compared to her severe flank pain back in August when we stented her. Denies UTI symptoms, no bladder pain. Her main complaint is nausea, weight loss and poor appetite.  History of right adrenal cancer: Approximately 1986 she underwent a right adrenalectomy at [facility] and received radiation as well resulting in complete atrophy of her right kidney. She was noted on imaging studies to have a markedly atrophic right kidney  Left renal lesion: She was found to have a small lesion within the left kidney in 2018. It was felt to possibly represent a neoplasm and has been followed by her oncologist, Dr. Gwynneth Macleod, at [facility] who recommended observation only due to the fact that she has essentially only a single renal unit   Past Medical History: Past Medical History:  Diagnosis Date  . Adrenal cancer, right ([place]) 1996  . AKI (acute kidney injury) ([place])   . Anemia   . Arthritis   . Atherosclerosis   . Atrophy of right kidney   . Cardiomegaly 11/03/2017   noted on CXR  . Carotid stenosis, bilateral    Moderate 60-79%  . CKD (chronic kidney disease)    stage 2-3  . Coronary artery disease   . Diverticular disease   . Duodenal obstruction 05/11/2017  . History of colon polyps   . History of goiter   . History  of pancreatitis   . Hydronephrosis of right kidney   . Hyperlipidemia   . Hypertension   . Hypothyroidism   . LVH (left ventricular hypertrophy)    Mild  . Mass of left kidney 12/20/2017   mixed cystic and solid enhancing mass in the midpole left kidney, noted on CT  . Mesenteric ischemia ([place])   . Mild bibasilar atelectasis 12/2017  . PAD (peripheral  artery disease) ([place])   . Pancreas cyst 12/20/2017   cysts in the tail of the pancreas noted on CT  . Pericardial effusion    Trivial  . Peripheral polyneuropathy   . Pneumonia   . Psoriasis    Scalp  . Pyelonephritis   . Renal insufficiency   . Restless leg syndrome   . Skin cancer    squamous  . SVT (supraventricular tachycardia) ([place])   . Transient ischemic attack (TIA)   . Wears glasses     Past Surgical History:  Past Surgical History:  Procedure Laterality Date  . ABDOMINAL HYSTERECTOMY  1975   excessive bleeding  . ADRENALECTOMY Right    with radiation  . APPENDECTOMY    . CHOLECYSTECTOMY    . COLONOSCOPY    . CYSTOSCOPY WITH RETROGRADE PYELOGRAM, URETEROSCOPY AND STENT PLACEMENT Right 12/22/2017   Procedure: CYSTOSCOPY WITH RETROGRADE PYELOGRAM, AND STENT PLACEMENT;  Surgeon: Kathie Rhodes, MD;  Location: [facility];  Service: Urology;  Laterality: Right;  . CYSTOSCOPY WITH URETEROSCOPY AND STENT PLACEMENT Right 01/22/2018   Procedure: CYSTOSCOPY WITH URETEROSCOPY / STENT REMOVAL/ BIOPSY AND STENT PLACEMENT;  Surgeon: Kathie Rhodes, MD;  Location: [facility];  Service: Urology;  Laterality: Right;  . Left cutdown  11/15/2016  . mesenteric ischemia stents  04/12/2017   x2  . RENAL ARTERY STENT Left 04/12/2017  . THYROIDECTOMY    . UPPER GI ENDOSCOPY    . URETERAL STENT PLACEMENT      Medication: Current Facility-Administered Medications  Medication Dose Route Frequency Provider Last Rate Last Dose  . 0.45 % sodium chloride infusion   Intravenous Continuous Georgette Shell, MD 100 mL/hr at 02/18/18 0849    . amLODipine (NORVASC) tablet 10 mg  10 mg Oral Daily Georgette Shell, MD   10 mg at 02/18/18 8469  . amoxicillin-clavulanate (AUGMENTIN) 500-125 MG per tablet 500 mg  1 tablet Oral Q12H Georgette Shell, MD   500 mg at 02/18/18 0941  . aspirin EC tablet 81 mg  81 mg Oral Daily Georgette Shell, MD   81 mg at 02/18/18 6295  .  clopidogrel (PLAVIX) tablet 75 mg  75 mg Oral Daily Georgette Shell, MD   75 mg at 02/18/18 0941  . diphenhydrAMINE (BENADRYL) capsule 50 mg  50 mg Oral QHS PRN Georgette Shell, MD      . enoxaparin (LOVENOX) injection 30 mg  30 mg Subcutaneous Q24H Georgette Shell, MD   30 mg at 02/17/18 2203  . feeding supplement (ENSURE ENLIVE) (ENSURE ENLIVE) liquid 237 mL  237 mL Oral BID BM Georgette Shell, MD      . hydrALAZINE (APRESOLINE) tablet 100 mg  100 mg Oral TID Georgette Shell, MD   100 mg at 02/18/18 0942  . levothyroxine (SYNTHROID, LEVOTHROID) tablet 112 mcg  112 mcg Oral QAC breakfast Georgette Shell, MD   112 mcg at 02/18/18 [id number]  . pantoprazole (PROTONIX) EC tablet 40 mg  40 mg Oral Daily Georgette Shell, MD   40 mg at  02/18/18 0942  . saccharomyces boulardii (FLORASTOR) capsule 250 mg  250 mg Oral BID Georgette Shell, MD   250 mg at 02/18/18 9485  . scopolamine (TRANSDERM-SCOP) 1 MG/3DAYS 1.5 mg  1 patch Transdermal Q72H Georgette Shell, MD   1.5 mg at 02/17/18 1745    Allergies: Allergies  Allergen Reactions  . Amoxicillin     Other reaction(s): Other (See Comments) Liver infection per pt report Caused liver infection   . Clonidine Anaphylaxis  . Sulfa Antibiotics Nausea Only  . Sulfamethoxazole Nausea And Vomiting  . Metoprolol     Other reaction(s): Dizziness (intolerance) syncope    Social History: Social History   Tobacco Use  . Smoking status: Never Smoker  . Smokeless tobacco: Never Used  Substance Use Topics  . Alcohol use: Never    Frequency: Never  . Drug use: Never    Family History Family History  Problem Relation Age of Onset  . Colon cancer Mother   . Prostate cancer Father   . Heart attack Sister   . Heart disease Brother   . Heart disease Brother     Review of Systems 10 systems were reviewed and are negative except as noted specifically in the HPI.  Objective   Vital signs in last 24 hours: BP  (!) 156/60 (BP Location: Left Arm)   Pulse 77   Temp 98.5 F (36.9 C) (Oral)   Resp 18   Ht 5\' 4"  (1.626 m)   Wt 50.1 kg   SpO2 97%   BMI 18.96 kg/m   Physical Exam General: NAD, A&O, resting, appropriate HEENT: [place]/AT, EOMI, MMM Pulmonary: Normal work of breathing Cardiovascular: HDS, adequate peripheral perfusion Abdomen: Soft, NTTP, nondistended GU: Mild right flank tenderness, which has been chronic Extremities: warm and well perfused Neuro: Appropriate, no focal neurological deficits  Most Recent Labs: Lab Results  Component Value Date   WBC 7.5 02/17/2018   HGB 11.9 (L) 02/17/2018   HCT 36.5 02/17/2018   PLT 159 02/17/2018    Lab Results  Component Value Date   NA 140 02/18/2018   K 3.9 02/18/2018   CL 105 02/18/2018   CO2 26 02/18/2018   BUN 21 02/18/2018   CREATININE 1.03 (H) 02/18/2018   CALCIUM 9.1 02/18/2018   MG 1.9 12/23/2017    No results found for: INR, APTT   IMAGING: Ct Renal Stone Study  Result Date: 02/17/2018 CLINICAL DATA:  Nausea for the past 2 years, worsening. Vomiting this morning. EXAM: CT ABDOMEN AND PELVIS WITHOUT CONTRAST TECHNIQUE: Multidetector CT imaging of the abdomen and pelvis was performed following the standard protocol without IV contrast. COMPARISON:  12/20/2017.  Abdomen MRI dated 07/17/2017. FINDINGS: Lower chest: Stable mild scarring at the left lung base. Small to moderate sized pericardial effusion with an interval increase in size, currently measuring 11 mm in thickness and 6 Hounsfield units in density. Hepatobiliary: No focal liver abnormality is seen. Status post cholecystectomy. No biliary dilatation. Pancreas: Diffusely atrophied. The previously demonstrated 2.6 x 2.2 cm pancreatic tail mass currently measures 2.6 x 2.3 cm on image number 29 series 2. The previously demonstrated 2nd, smaller pancreatic tail mass is not clearly visible today without intravenous contrast. Spleen: Normal in size without focal abnormality.  Adrenals/Urinary Tract: Stable severely atrophied right kidney with marked hydronephrosis. The previously demonstrated 1.8 cm anterior left renal mass is not well visualized without intravenous contrast, grossly unchanged. Unremarkable bladder and ureters. Stomach/Bowel: Mild sigmoid colon diverticulosis. Surgically absent appendix. Unremarkable stomach  and small bowel. Vascular/Lymphatic: Atheromatous arterial calcifications without aneurysm. No enlarged lymph nodes. Celiac axis, superior mesenteric and left renal artery stents. Both the celiac axis and SMA stents have a crimped appearance, with marked narrowing of the SMA stent on the sagittal images and probable moderate narrowing of the celiac axis stent. Reproductive: Status post hysterectomy. No adnexal masses. Other: No abdominal wall hernia or abnormality. No abdominopelvic ascites. Upper abdominal surgical clips on the right. Musculoskeletal: Moderate levoconvex thoracolumbar scoliosis and degenerative changes. IMPRESSION: 1. No acute abnormality. 2. Small to moderate-sized pericardial effusion with an interval increase in size. 3. Stable pancreatic tail cyst, currently measuring 2.6 x 2.3 cm. 4. 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 5. Grossly stable left renal mass, incompletely evaluated without intravenous contrast. 6. Crimped appearance of the celiac axis and SMA stents with luminal narrowing, as described above. Electronically Signed   By: Claudie Revering M.D.   On: 02/17/2018 11:24    ------  Assessment:  1. Right hydronephrosis + right renal atrophy - Based on previous workup this likely represents chronic nonobstructive hydronephrosis. She does not appear acutely infected and renal function is at baseline. Additionally, she denies any change in chronic right flank discomfort (actually marked improvement since consultation in Aug 2019). Recommend conservative monitoring  only for this now. If she acutely decompensates and urosepsis suspected, please let us know as Right nephrostomy tube or right ureteral stent placement may be indicated. Long-term she will follow up with [place] in clinic and we may still entertain the option of a right nephrectomy.   2. Recurrent UTIs - likely secondary to right upper tract urinary stasis as above. She does not appear infected presently and denies lower urinary tract symptoms. Urine culture may return positive or multi-organismal and would be cautious to treat in the absence of other positive signs or symptoms. She completed a 14 day course of culture-specific Augmentin which relieved her prior symptoms.   3. Left renal mass - ~1.8 cm, similar size by estimation on recent non-contrasted scan. This is nonoperative at this time and can be surveyed periodically given her other comorbidities.    Thank you for this consult. Please contact the urology consult pager with any further questions/concerns.

## 2018-02-18 NOTE — Progress Notes (Signed)
  Echocardiogram 2D Echocardiogram has been performed.  Brooke Walker 02/18/2018, 8:46 AM

## 2018-02-18 NOTE — Progress Notes (Signed)
PROGRESS NOTE    Brooke Walker  CWC:[id number] DOB: 1930/11/04 DOA: 02/17/2018 PCP: Caren Macadam, MD Brief Narrative:84 y.o. female with medical history significant of adrenal cancer, right kidney stent x2 which was removed, mesenteric artery stenting x2, Was transferred from [facility] with complaints of intractable nausea and vomiting decreased appetite and weight loss.  Patient reports she lost 15 pounds in the last 6 weeks.  She absolutely has no appetite.  She denies any change in bowel habits no constipation diarrhea, no melena no hematuria hematochezia no fever no chills no cough no chest pain no shortness of breath no abdominal pain no urinary complaints no burning urination no fever or chills.  Reports that she sees Dr. Jerene Pitch at [facility] and he did EGD and colonoscopy which apparently was said to be normal per patient.  She has decided to come to [facility] because [place] far for her and she wants further medical care and [place].  She reports that she has had EGD and colonoscopy with no findings that is abnormal, she was told she has pancreatic cyst that was biopsied x2 which was benign, she has a history of diverticulitis and was hospitalized in 2016.  She reports nausea does not get better with Zofran.  Denies any shortness of breath cough lower extremity edema, no dizziness no lightheadedness.  Patient has a son in the area. Been on antibiotics on and off for few weeks due to kidney infections followed at  [facility] urology. She denies any headache, changes with her vision, changes with smell.  ED Course: Patient transferred from [facility] where she received Reglan and when I saw her she want to eat and her nausea was better  Assessment & Plan:   Active Problems:   Abnormal LFTs   Intractable nausea and vomiting   Intractable vomiting   Hepatitis  1]Abnormal LFTS-patient admitted with initial complaints of nausea and vomiting for over  2 to 3 years.  At this time she reports her nausea improved with a scopolamine patch.  She tolerated p.o. intake and her dinner last night.  She has had extensive work-up done at [facility] with Dr. Jerene Pitch.  Records have been requested.  Hepatitis panel is pending at this time.  Patient has been on statin prior to admission along with Augmentin.  Statin has not been restarted in the hospital.  I have placed a call to [facility] to see why she is on Augmentin and what the culture showed to see if there is any way we can change her antibiotics.MRCP ordered. Patient also has a known history of right adrenal cancer.  And a mass on the left kidney mixed cystic and solid enhancing mass which was present on 12/20/2017 and is still showing up in the CT of the abdomen.  CT scan of the abdomen that was done shows moderate sized pericardial effusion with an interval increase in size, stable pancreatic tail cyst, atrophied right kidney with moderate hydronephrosis most likely from chronic UPJ obstruction, stable left renal mass.  Appreciate GI consult.  2] patient with complex urological history with hydronephrosis status post recent stent and removal of the stent by Dr. Karsten Ro Urine culture grew enterococcus on September 24 of 2019.  It appears that she got Augmentin at least for 10 to 14 days.  Therefore I will DC the Augmentin repeat urine culture.  Discussed with on-call urology nurse who will convey the message to Dr. Karsten Ro or his partner tomorrow  and will see this patient in consult on Monday.  3] pericardial effusion by CT scan echocardiogram ordered.  Patient is asymptomatic no shortness of breath or hypoxia she appears comfortable on room air.  4] CKD stage III with atrophied right kidney and left renal mass creatinine stable at this time.  She has chronic moderate UPJ obstruction of the right kidney.  5] pretension continue home medications  DVT prophylaxis: Lovenox Code Status: Full  code Family Communication: No family available Disposition Plan: Once liver function normalizes and GI work-up is done consider discharge  Consultants: GI, urology  Procedures: None Antimicrobials: None  Subjective: Resting in bed feels better nausea better was able to tolerate p.o. intake last night feels like the scopolamine patch helped her.   Objective: Vitals:   02/17/18 1343 02/17/18 1519 02/17/18 2057 02/18/18 0458  BP: (!) 179/88 (!) 182/56 (!) 172/53 (!) 156/60  Pulse: 95 85 78 77  Resp: 18 16 18 18   Temp: 98.3 F (36.8 C) 98.2 F (36.8 C) 98.5 F (36.9 C) 98.5 F (36.9 C)  TempSrc: Oral Oral Oral Oral  SpO2: 95% 96% 96% 97%  Weight:  50.1 kg    Height:  5\' 4"  (1.626 m)      Intake/Output Summary (Last 24 hours) at 02/18/2018 1224 Last data filed at 02/18/2018 0832 Gross per 24 hour  Intake 1537.28 ml  Output 450 ml  Net 1087.28 ml   Filed Weights   02/17/18 1009 02/17/18 1519  Weight: 51.7 kg 50.1 kg    Examination:  General exam: Appears calm and comfortable  Respiratory system: Clear to auscultation. Respiratory effort normal. Cardiovascular system: S1 & S2 heard, RRR. No JVD, murmurs, rubs, gallops or clicks. No pedal edema. Gastrointestinal system: Abdomen is nondistended, soft and nontender. No organomegaly or masses felt. Normal bowel sounds heard. Central nervous system: Alert and oriented. No focal neurological deficits. Extremities: Symmetric 5 x 5 power. Skin: No rashes, lesions or ulcers Psychiatry: Judgement and insight appear normal. Mood & affect appropriate.     Data Reviewed: I have personally reviewed following labs and imaging studies  CBC: Recent Labs  Lab 02/17/18 1041  WBC 7.5  HGB 11.9*  HCT 36.5  MCV 94.8  PLT 355   Basic Metabolic Panel: Recent Labs  Lab 02/17/18 1041 02/18/18 0525  NA 136 140  K 4.2 3.9  CL 100 105  CO2 24 26  GLUCOSE 108* 92  BUN 20 21  CREATININE 1.01* 1.03*  CALCIUM 9.3 9.1    GFR: Estimated Creatinine Clearance: 31.6 mL/min (A) (by C-G formula based on SCr of 1.03 mg/dL (H)). Liver Function Tests: Recent Labs  Lab 02/17/18 1041 02/18/18 0525  AST 242* 281*  ALT 239* 246*  ALKPHOS 195* 210*  BILITOT 0.6 0.6  PROT 7.0 6.1*  ALBUMIN 3.7 3.4*   Recent Labs  Lab 02/17/18 1041  LIPASE 23   No results for input(s): AMMONIA in the last 168 hours. Coagulation Profile: No results for input(s): INR, PROTIME in the last 168 hours. Cardiac Enzymes: No results for input(s): CKTOTAL, CKMB, CKMBINDEX, TROPONINI in the last 168 hours. BNP (last 3 results) No results for input(s): PROBNP in the last 8760 hours. HbA1C: No results for input(s): HGBA1C in the last 72 hours. CBG: No results for input(s): GLUCAP in the last 168 hours. Lipid Profile: No results for input(s): CHOL, HDL, LDLCALC, TRIG, CHOLHDL, LDLDIRECT in the last 72 hours. Thyroid Function Tests: No results for input(s): TSH, T4TOTAL, FREET4, T3FREE, THYROIDAB  in the last 72 hours. Anemia Panel: No results for input(s): VITAMINB12, FOLATE, FERRITIN, TIBC, IRON, RETICCTPCT in the last 72 hours. Sepsis Labs: No results for input(s): PROCALCITON, LATICACIDVEN in the last 168 hours.  No results found for this or any previous visit (from the past 240 hour(s)).       Radiology Studies: Ct Renal Stone Study  Result Date: 02/17/2018 CLINICAL DATA:  Nausea for the past 2 years, worsening. Vomiting this morning. EXAM: CT ABDOMEN AND PELVIS WITHOUT CONTRAST TECHNIQUE: Multidetector CT imaging of the abdomen and pelvis was performed following the standard protocol without IV contrast. COMPARISON:  12/20/2017.  Abdomen MRI dated 07/17/2017. FINDINGS: Lower chest: Stable mild scarring at the left lung base. Small to moderate sized pericardial effusion with an interval increase in size, currently measuring 11 mm in thickness and 6 Hounsfield units in density. Hepatobiliary: No focal liver abnormality is  seen. Status post cholecystectomy. No biliary dilatation. Pancreas: Diffusely atrophied. The previously demonstrated 2.6 x 2.2 cm pancreatic tail mass currently measures 2.6 x 2.3 cm on image number 29 series 2. The previously demonstrated 2nd, smaller pancreatic tail mass is not clearly visible today without intravenous contrast. Spleen: Normal in size without focal abnormality. Adrenals/Urinary Tract: Stable severely atrophied right kidney with marked hydronephrosis. The previously demonstrated 1.8 cm anterior left renal mass is not well visualized without intravenous contrast, grossly unchanged. Unremarkable bladder and ureters. Stomach/Bowel: Mild sigmoid colon diverticulosis. Surgically absent appendix. Unremarkable stomach and small bowel. Vascular/Lymphatic: Atheromatous arterial calcifications without aneurysm. No enlarged lymph nodes. Celiac axis, superior mesenteric and left renal artery stents. Both the celiac axis and SMA stents have a crimped appearance, with marked narrowing of the SMA stent on the sagittal images and probable moderate narrowing of the celiac axis stent. Reproductive: Status post hysterectomy. No adnexal masses. Other: No abdominal wall hernia or abnormality. No abdominopelvic ascites. Upper abdominal surgical clips on the right. Musculoskeletal: Moderate levoconvex thoracolumbar scoliosis and degenerative changes. IMPRESSION: 1. No acute abnormality. 2. Small to moderate-sized pericardial effusion with an interval increase in size. 3. Stable pancreatic tail cyst, currently measuring 2.6 x 2.3 cm. 4. 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 5. Grossly stable left renal mass, incompletely evaluated without intravenous contrast. 6. Crimped appearance of the celiac axis and SMA stents with luminal narrowing, as described above. Electronically Signed   By: Claudie Revering M.D.   On: 02/17/2018 11:24        Scheduled  Meds: . amLODipine  10 mg Oral Daily  . amoxicillin-clavulanate  1 tablet Oral Q12H  . aspirin EC  81 mg Oral Daily  . clopidogrel  75 mg Oral Daily  . enoxaparin (LOVENOX) injection  30 mg Subcutaneous Q24H  . feeding supplement (ENSURE ENLIVE)  237 mL Oral BID BM  . hydrALAZINE  100 mg Oral TID  . levothyroxine  112 mcg Oral QAC breakfast  . pantoprazole  40 mg Oral Daily  . saccharomyces boulardii  250 mg Oral BID  . scopolamine  1 patch Transdermal Q72H   Continuous Infusions: . sodium chloride 100 mL/hr at 02/18/18 0849     LOS: 0 days     Georgette Shell, MD [facility] If 7PM-7AM, please contact night-coverage [email or web address] Password [email or web address] 02/18/2018, 12:24 PM

## 2018-02-18 NOTE — Progress Notes (Signed)
Patient's case discussed with hospital team and hospital computer reviewed and I will try to get back to interview patient otherwise will turn over to next week's team however with her increased liver tests new might consider stopping her cholesterol medicine and changing her antibiotics since they are the most likely medicine culprits and consider an MRCP even though an MRI of the pancreas was done in the spring to make sure she does not have CBD stones and consider at some point a gastric emptying study to evaluate her nausea and vomiting and you will need to call [facility] to get her actual GI records for the results of her multiple procedures there with further recommendations to follow after patient is seen

## 2018-02-18 NOTE — Plan of Care (Signed)
Patient continues to tolerate a regular diet, no nausea or vomiting on 7 a to 7 p shift.  Patient verbalizes understanding that she is nothing by mouth after midnight for MRI of abdomen in the early a.m. Multiple family members at bedside this shift.

## 2018-02-19 ENCOUNTER — Inpatient Hospital Stay (HOSPITAL_COMMUNITY): Payer: Medicare Other

## 2018-02-19 DIAGNOSIS — L8990 Pressure ulcer of unspecified site, unspecified stage: Secondary | ICD-10-CM

## 2018-02-19 DIAGNOSIS — E440 Moderate protein-calorie malnutrition: Secondary | ICD-10-CM

## 2018-02-19 LAB — COMPREHENSIVE METABOLIC PANEL
ALBUMIN: 3.1 g/dL — AB (ref 3.5–5.0)
ALT: 282 U/L — AB (ref 0–44)
ANION GAP: 7 (ref 5–15)
AST: 287 U/L — ABNORMAL HIGH (ref 15–41)
Alkaline Phosphatase: 219 U/L — ABNORMAL HIGH (ref 38–126)
BILIRUBIN TOTAL: 0.5 mg/dL (ref 0.3–1.2)
BUN: 12 mg/dL (ref 8–23)
CALCIUM: 8.6 mg/dL — AB (ref 8.9–10.3)
CO2: 23 mmol/L (ref 22–32)
CREATININE: 0.82 mg/dL (ref 0.44–1.00)
Chloride: 106 mmol/L (ref 98–111)
GFR calc non Af Amer: >60 mL/min (ref >60)
GLUCOSE: 108 mg/dL — AB (ref 70–99)
Potassium: 4.2 mmol/L (ref 3.5–5.1)
SODIUM: 136 mmol/L (ref 135–145)
TOTAL PROTEIN: 6 g/dL — AB (ref 6.5–8.1)

## 2018-02-19 LAB — URINE CULTURE: CULTURE: NO GROWTH

## 2018-02-19 LAB — T4, FREE: Free T4: 0.93 ng/dL (ref 0.82–1.77)

## 2018-02-19 LAB — TSH: TSH: 29.435 u[IU]/mL — ABNORMAL HIGH (ref 0.350–4.500)

## 2018-02-19 LAB — PROTIME-INR
INR: 0.91
Prothrombin Time: 12.2 seconds (ref 11.4–15.2)

## 2018-02-19 MED ORDER — ALUM & MAG HYDROXIDE-SIMETH 200-200-20 MG/5ML PO SUSP
30.0000 mL | Freq: Four times a day (QID) | ORAL | Status: DC | PRN
  Administered 2018-02-19: 30 mL via ORAL
  Filled 2018-02-19: qty 30

## 2018-02-19 MED ORDER — ENOXAPARIN SODIUM 40 MG/0.4ML ~~LOC~~ SOLN
40.0000 mg | SUBCUTANEOUS | Status: DC
  Administered 2018-02-19 – 2018-02-20 (×2): 40 mg via SUBCUTANEOUS
  Filled 2018-02-19 (×2): qty 0.4

## 2018-02-19 MED ORDER — GADOBUTROL 1 MMOL/ML IV SOLN
7.5000 mL | Freq: Once | INTRAVENOUS | Status: AC | PRN
  Administered 2018-02-19: 5 mL via INTRAVENOUS

## 2018-02-19 MED ORDER — ADULT MULTIVITAMIN W/MINERALS CH
1.0000 | ORAL_TABLET | Freq: Every day | ORAL | Status: DC
  Administered 2018-02-19 – 2018-02-21 (×3): 1 via ORAL
  Filled 2018-02-19 (×3): qty 1

## 2018-02-19 NOTE — Progress Notes (Signed)
Initial Nutrition Assessment  DOCUMENTATION CODES:   Non-severe (moderate) malnutrition in context of chronic illness  INTERVENTION:  - Continue Ensure Enlive po BID, each supplement provides 350 kcal and 20 grams of protein - Will order daily multivitamin with minerals.  - Continue to encourage PO intakes.    NUTRITION DIAGNOSIS:   Moderate Malnutrition related to chronic illness(chronic nausea x3 years) as evidenced by moderate fat depletion, moderate muscle depletion.  GOAL:   Patient will meet greater than or equal to 90% of their needs  MONITOR:   PO intake, Supplement acceptance, Weight trends, Labs, I & O's  REASON FOR ASSESSMENT:   Malnutrition Screening Tool  ASSESSMENT:   84 y.o. female with medical history significant of adrenal cancer, right kidney stent x2 which was removed, mesenteric artery stenting x2, benign pancreatic cyst, and diverticulosis with at least one flare of diverticulitis. She was transferred from [facility] with complaints of intractable N/V, decreased appetite/no appetite, and weight loss. She reported that she has had EGD and colonoscopy with no abnormal findings. She reported nausea does not get better with Zofran.  BMI indicates normal weight/borderline underweight. Per chart review, patient consumed 50% of lunch and 75% of dinner yesterday. She reports having a bad episode of heartburn last night (which is not unusual for her) and that this prevented her from having breakfast, although she was able to drink some coffee and about half of an Ensure around breakfast time. For lunch she had chicken salad with vegetables.  Patient denies any abdominal pain/pressure or nausea with meals. She states that she has had no nausea today and did not experience it during any part of the day yesterday. Patient reports chronic nausea which has been ongoing, mainly constant in nature, x3 years. This causes a lack of desire to eat, but eating does not  exacerbate nausea. Nausea does often lead to vomiting.   She has not consumed Ensure or Boost supplements in the past but is thinking of getting something like this or whole milk for at home for times when she does not feel like eating. Talked with patient about ONS and about making smoothies or milkshakes for those times.   Patient reports that she has lost 15 lb over the past 6 weeks. Per chart review, she has lost 5 lb (4.3% body weight) in the past 4 weeks; not significant for time frame. She has lost 15 lb (12% body weight) in the past 6-7 weeks; significant for time frame. Weight had previously been stable throughout the summer. Question if at least part of this is related to fluid loses d/t patient reporting an increase in N/V recently.   Medications reviewed; 112 mcg oral Synthroid/day, 250 mg Florastor BID. Labs reviewed; Alk Phos elevated, LFTs elevated. IVF; 1/2 NS @ 100 ml/hr.       NUTRITION - FOCUSED PHYSICAL EXAM:    Most Recent Value  Orbital Region  Mild depletion  Upper Arm Region  Moderate depletion  Thoracic and Lumbar Region  Unable to assess  Buccal Region  Moderate depletion  Temple Region  Moderate depletion  Clavicle Bone Region  Mild depletion  Clavicle and Acromion Bone Region  Moderate depletion  Scapular Bone Region  Unable to assess  Dorsal Hand  Mild depletion  Patellar Region  Unable to assess  Anterior Thigh Region  Unable to assess  Posterior Calf Region  Unable to assess  Edema (RD Assessment)  Unable to assess  Hair  Reviewed  Eyes  Reviewed  Mouth  Reviewed  Skin  Reviewed  Nails  Reviewed       Diet Order:   Diet Order            Diet regular Room service appropriate? Yes; Fluid consistency: Thin  Diet effective now              EDUCATION NEEDS:   Education needs have been addressed  Skin:  Skin Assessment: Skin Integrity Issues: Skin Integrity Issues:: Stage II Stage II: bilateral buttocks  Last BM:  10/4 (the day  PTA)  Height:   Ht Readings from Last 1 Encounters:  02/17/18 5' 4"  (1.626 m)    Weight:   Wt Readings from Last 1 Encounters:  02/17/18 50.1 kg    Ideal Body Weight:  54.54 kg  BMI:  Body mass index is 18.96 kg/m.  Estimated Nutritional Needs:   Kcal:  1405-1605 (28-32 kcal/kg)  Protein:  50-60 grams  Fluid:  >/= 1.5 L/day     Jarome Matin, MS, RD, LDN, [facility] Inpatient Clinical Dietitian Pager # [phone] After hours/weekend pager # [phone]

## 2018-02-19 NOTE — Consult Note (Signed)
[facility] CONS[facility] Reason for consult: Abnormal liver test Referring Physician: [facility] hospitalist. PCP: Dr Ethlyn Gallery.  Primary GI: None patient is unassigned  NNENNA MEADOR is an 84 y.o. female.  HPI: We were asked to see her for evaluation of abnormal liver test.  She is 84 years old and has moved to the [place] area.  She previously lived in [place].  She was seen at the emergency room with complaints of intractable nausea and vomiting poor appetite and weight loss.  She has seen Dr. Jerene Pitch over at [facility] and has had EGD and colonoscopy recently that were normal according to the patient.  She does have a pancreatic cyst that has been biopsied in the past and is benign.  Reviewed records from care anywhere and the cyst was present in 2016.  She has a history of diverticulitis.  Her renal history is somewhat complicated and that she has a history of right adrenal cancer and has undergone radiation to this area.  According to the patient, her right kidney has been dysfunctional and somewhat tender ever since.  She has a mass in the left kidney which apparently is been biopsied and is benign.  She has severe vascular disease and has several stents in the celiac and SMA. We were asked to see the patient because of elevated liver tests.  Her bilirubin has been normal and transaminases are up to around 250.  Alk phos and albumin are abnormal slightly.  Alk phos is right at 200.  The patient is post cholecystectomy and had an MRCP done earlier this morning results still pending.  The patient did have signs of UTI and was initially started on amoxicillin upon admission.  She gives me the additional history that she has a history of severe hepatitis from amoxicillin in the past while living in [place].  She apparently has had it only for 2 days here in the hospital and his now been changed.  She feels well otherwise.  Past Medical History:  Diagnosis Date  . Adrenal cancer, right ([place]) 1996   . AKI (acute kidney injury) ([place])   . Anemia   . Arthritis   . Atherosclerosis   . Atrophy of right kidney   . Cardiomegaly 11/03/2017   noted on CXR  . Carotid stenosis, bilateral    Moderate 60-79%  . CKD (chronic kidney disease)    stage 2-3  . Coronary artery disease   . Diverticular disease   . Duodenal obstruction 05/11/2017  . History of colon polyps   . History of goiter   . History of pancreatitis   . Hydronephrosis of right kidney   . Hyperlipidemia   . Hypertension   . Hypothyroidism   . LVH (left ventricular hypertrophy)    Mild  . Mass of left kidney 12/20/2017   mixed cystic and solid enhancing mass in the midpole left kidney, noted on CT  . Mesenteric ischemia ([place])   . Mild bibasilar atelectasis 12/2017  . PAD (peripheral artery disease) ([place])   . Pancreas cyst 12/20/2017   cysts in the tail of the pancreas noted on CT  . Pericardial effusion    Trivial  . Peripheral polyneuropathy   . Pneumonia   . Psoriasis    Scalp  . Pyelonephritis   . Renal insufficiency   . Restless leg syndrome   . Skin cancer    squamous  . SVT (supraventricular tachycardia) ([place])   . Transient ischemic attack (TIA)   . Wears glasses  Past Surgical History:  Procedure Laterality Date  . ABDOMINAL HYSTERECTOMY  1975   excessive bleeding  . ADRENALECTOMY Right    with radiation  . APPENDECTOMY    . CHOLECYSTECTOMY    . COLONOSCOPY    . CYSTOSCOPY WITH RETROGRADE PYELOGRAM, URETEROSCOPY AND STENT PLACEMENT Right 12/22/2017   Procedure: CYSTOSCOPY WITH RETROGRADE PYELOGRAM, AND STENT PLACEMENT;  Surgeon: Kathie Rhodes, MD;  Location: [facility];  Service: Urology;  Laterality: Right;  . CYSTOSCOPY WITH URETEROSCOPY AND STENT PLACEMENT Right 01/22/2018   Procedure: CYSTOSCOPY WITH URETEROSCOPY / STENT REMOVAL/ BIOPSY AND STENT PLACEMENT;  Surgeon: Kathie Rhodes, MD;  Location: [facility];  Service: Urology;  Laterality: Right;  . Left cutdown  11/15/2016  .  mesenteric ischemia stents  04/12/2017   x2  . RENAL ARTERY STENT Left 04/12/2017  . THYROIDECTOMY    . UPPER GI ENDOSCOPY    . URETERAL STENT PLACEMENT      Family History  Problem Relation Age of Onset  . Colon cancer Mother   . Prostate cancer Father   . Heart attack Sister   . Heart disease Brother   . Heart disease Brother     Social History:  reports that she has never smoked. She has never used smokeless tobacco. She reports that she does not drink alcohol or use drugs.  Allergies:  Allergies  Allergen Reactions  . Amoxicillin     Other reaction(s): Other (See Comments) Liver infection per pt report Caused liver infection   . Clonidine Anaphylaxis  . Sulfa Antibiotics Nausea Only  . Sulfamethoxazole Nausea And Vomiting  . Metoprolol     Other reaction(s): Dizziness (intolerance) syncope    Medications; Prior to Admission medications   Medication Sig Start Date End Date Taking? Authorizing Provider  amLODipine (NORVASC) 10 MG tablet Take 1 tablet (10 mg total) by mouth daily. 12/25/17  Yes Irene Pap N, DO  amoxicillin-clavulanate (AUGMENTIN) 500-125 MG tablet Take 1 tablet by mouth every 12 (twelve) hours. 02/12/18  Yes [provider]  Apremilast (OTEZLA PO) Take 30 mg by mouth daily.    Yes [provider]  aspirin EC 81 MG tablet Take by mouth.   Yes [provider]  carvedilol (COREG) 25 MG tablet Take 25 mg by mouth 2 (two) times daily with a meal. 02/05/18  Yes [provider]  clopidogrel (PLAVIX) 75 MG tablet TAKE 1 TABLET BY MOUTH EVERY DAY 08/04/17  Yes [provider]  diphenhydrAMINE (BENADRYL) 25 MG tablet Take 25 mg by mouth at bedtime.   Yes [provider]  docusate sodium (COLACE) 100 MG capsule Take 100 mg by mouth as needed for mild constipation.    Yes [provider]  estradiol (ESTRACE) 0.1 MG/GM vaginal cream Place 1 Applicatorful vaginally 3 (three) times a week.  08/04/16  Yes  [provider]  hydrALAZINE (APRESOLINE) 100 MG tablet Take 1 tablet (100 mg total) by mouth 3 (three) times daily. Patient taking differently: Take 50 mg by mouth 2 (two) times daily.  12/25/17  Yes Kayleen Memos, DO  hydrochlorothiazide (HYDRODIURIL) 25 MG tablet Take 1 tablet (25 mg total) by mouth daily. 12/25/17  Yes Hall, Carole N, DO  ketoconazole (NIZORAL) 2 % shampoo Apply 1 application topically 3 (three) times a week. Use three times weekly for two weeks. 11/15/17  Yes Quintella Reichert, MD  levothyroxine (SYNTHROID, LEVOTHROID) 112 MCG tablet Take 112 mcg by mouth daily before breakfast.  09/18/17  Yes [provider]  ondansetron (ZOFRAN) 8 MG tablet Take 8 mg by mouth as needed for nausea/vomiting. 01/31/18  Yes [provider]  polyethylene glycol (MIRALAX / GLYCOLAX) packet Take 17 g by mouth daily. Patient taking differently: Take 17 g by mouth daily as needed for mild constipation.  12/25/17  Yes Hall, Carole N, DO  pravastatin (PRAVACHOL) 40 MG tablet Take 40 mg by mouth daily.  08/15/12  Yes [provider]  ferrous sulfate 325 (65 FE) MG tablet Take 1 tablet (325 mg total) by mouth 2 (two) times daily with a meal. Patient not taking: Reported on 02/17/2018 12/25/17   Kayleen Memos, DO   . amLODipine  10 mg Oral Daily  . aspirin EC  81 mg Oral Daily  . clopidogrel  75 mg Oral Daily  . enoxaparin (LOVENOX) injection  30 mg Subcutaneous Q24H  . feeding supplement (ENSURE ENLIVE)  237 mL Oral BID BM  . hydrALAZINE  100 mg Oral TID  . levothyroxine  112 mcg Oral QAC breakfast  . pantoprazole  40 mg Oral Daily  . saccharomyces boulardii  250 mg Oral BID  . scopolamine  1 patch Transdermal Q72H   PRN Meds alum & mag hydroxide-simeth, diphenhydrAMINE, hydrocortisone cream Results for orders placed or performed during the hospital encounter of 02/17/18 (from the past 48 hour(s))  Urinalysis, Routine w reflex microscopic     Status: Abnormal    Collection Time: 02/17/18 10:24 AM  Result Value Ref Range   Color, Urine YELLOW YELLOW   APPearance CLEAR CLEAR   Specific Gravity, Urine 1.025 1.005 - 1.030   pH 6.0 5.0 - 8.0   Glucose, UA NEGATIVE NEGATIVE mg/dL   Hgb urine dipstick NEGATIVE NEGATIVE   Bilirubin Urine SMALL (A) NEGATIVE   Ketones, ur 15 (A) NEGATIVE mg/dL   Protein, ur 100 (A) NEGATIVE mg/dL   Nitrite NEGATIVE NEGATIVE   Leukocytes, UA NEGATIVE NEGATIVE    Comment: Performed at [facility], [address] [place], [place] [address]  Urinalysis, Microscopic (reflex)     Status: Abnormal   Collection Time: 02/17/18 10:24 AM  Result Value Ref Range   RBC / HPF 0-5 0 - 5 RBC/hpf   WBC, UA 0-5 0 - 5 WBC/hpf   Bacteria, UA MANY (A) NONE SEEN   Squamous Epithelial / LPF 0-5 0 - 5   Hyaline Casts, UA PRESENT     Comment: Performed at [facility], [address] [place], [place] [address]  Lipase, blood     Status: None   Collection Time: 02/17/18 10:41 AM  Result Value Ref Range   Lipase 23 11 - 51 U/L    Comment: Performed at [facility], [address] [place], [place] [address]  Comprehensive metabolic panel     Status: Abnormal   Collection Time: 02/17/18 10:41 AM  Result Value Ref Range   Sodium 136 135 - 145 mmol/L   Potassium 4.2 3.5 - 5.1 mmol/L   Chloride 100 98 - 111 mmol/L   CO2 24 22 - 32 mmol/L   Glucose, Bld 108 (H) 70 - 99 mg/dL   BUN 20 8 - 23 mg/dL   Creatinine, Ser 1.01 (H) 0.44 - 1.00 mg/dL   Calcium 9.3 8.9 - 10.3 mg/dL   Total Protein 7.0 6.5 - 8.1 g/dL   Albumin 3.7 3.5 - 5.0 g/dL   AST 242 (H) 15 - 41 U/L   ALT 239 (H)  0 - 44 U/L   Alkaline Phosphatase 195 (H) 38 - 126 U/L   Total Bilirubin 0.6 0.3 - 1.2 mg/dL   GFR calc non Af Amer 49 (L) >60 mL/min   GFR calc Af Amer 57 (L) >60 mL/min    Comment: (NOTE) The eGFR has been calculated using the CKD EPI equation. This calculation has not been validated in all clinical situations. eGFR's  persistently <60 mL/min signify possible Chronic Kidney Disease.    Anion gap 12 5 - 15    Comment: Performed at [facility], [address] [place], [place] [address]  CBC     Status: Abnormal   Collection Time: 02/17/18 10:41 AM  Result Value Ref Range   WBC 7.5 4.0 - 10.5 K/uL   RBC 3.85 (L) 3.87 - 5.11 MIL/uL   Hemoglobin 11.9 (L) 12.0 - 15.0 g/dL   HCT 36.5 36.0 - 46.0 %   MCV 94.8 78.0 - 100.0 fL   MCH 30.9 26.0 - 34.0 pg   MCHC 32.6 30.0 - 36.0 g/dL   RDW 14.1 11.5 - 15.5 %   Platelets 159 150 - 400 K/uL    Comment: Performed at [facility], [address] [place], [place] [address]  Hepatitis panel, acute     Status: None   Collection Time: 02/17/18 12:45 PM  Result Value Ref Range   Hepatitis B Surface Ag Negative Negative   HCV Ab <0.1 0.0 - 0.9 s/co ratio    Comment: (NOTE)                                  Negative:     < 0.8                             Indeterminate: 0.8 - 0.9                                  Positive:     > 0.9 The CDC recommends that a positive HCV antibody result be followed up with a HCV Nucleic Acid Amplification test (510258). Performed At: [facility] [place], [place] [id number] Rush Farmer MD [id number]    Hep A IgM Negative Negative   Hep B C IgM Negative Negative  Comprehensive metabolic panel     Status: Abnormal   Collection Time: 02/18/18  5:25 AM  Result Value Ref Range   Sodium 140 135 - 145 mmol/L   Potassium 3.9 3.5 - 5.1 mmol/L   Chloride 105 98 - 111 mmol/L   CO2 26 22 - 32 mmol/L   Glucose, Bld 92 70 - 99 mg/dL   BUN 21 8 - 23 mg/dL   Creatinine, Ser 1.03 (H) 0.44 - 1.00 mg/dL   Calcium 9.1 8.9 - 10.3 mg/dL   Total Protein 6.1 (L) 6.5 - 8.1 g/dL   Albumin 3.4 (L) 3.5 - 5.0 g/dL   AST 281 (H) 15 - 41 U/L   ALT 246 (H) 0 - 44 U/L   Alkaline Phosphatase 210 (H) 38 - 126 U/L   Total Bilirubin 0.6 0.3 - 1.2 mg/dL   GFR calc non Af Amer 48 (L) >60 mL/min   GFR calc  Af Amer 56 (L) >60 mL/min    Comment: (NOTE) The  eGFR has been calculated using the CKD EPI equation. This calculation has not been validated in all clinical situations. eGFR's persistently <60 mL/min signify possible Chronic Kidney Disease.    Anion gap 9 5 - 15    Comment: Performed at [facility], [place] [address], [place], [place] [address]  TSH     Status: Abnormal   Collection Time: 02/19/18  5:01 AM  Result Value Ref Range   TSH 29.435 (H) 0.350 - 4.500 uIU/mL    Comment: Performed by a 3rd Generation assay with a functional sensitivity of <=0.01 uIU/mL. Performed at [facility], [place] [address] [place], [place] [address]   Comprehensive metabolic panel     Status: Abnormal   Collection Time: 02/19/18  5:01 AM  Result Value Ref Range   Sodium 136 135 - 145 mmol/L   Potassium 4.2 3.5 - 5.1 mmol/L   Chloride 106 98 - 111 mmol/L   CO2 23 22 - 32 mmol/L   Glucose, Bld 108 (H) 70 - 99 mg/dL   BUN 12 8 - 23 mg/dL   Creatinine, Ser 0.82 0.44 - 1.00 mg/dL   Calcium 8.6 (L) 8.9 - 10.3 mg/dL   Total Protein 6.0 (L) 6.5 - 8.1 g/dL   Albumin 3.1 (L) 3.5 - 5.0 g/dL   AST 287 (H) 15 - 41 U/L   ALT 282 (H) 0 - 44 U/L   Alkaline Phosphatase 219 (H) 38 - 126 U/L   Total Bilirubin 0.5 0.3 - 1.2 mg/dL   GFR calc non Af Amer >60 >60 mL/min   GFR calc Af Amer >60 >60 mL/min    Comment: (NOTE) The eGFR has been calculated using the CKD EPI equation. This calculation has not been validated in all clinical situations. eGFR's persistently <60 mL/min signify possible Chronic Kidney Disease.    Anion gap 7 5 - 15    Comment: Performed at [facility], [place] [address], [place], [place] [address]    Ct Renal Stone Study  Result Date: 02/17/2018 CLINICAL DATA:  Nausea for the past 2 years, worsening. Vomiting this morning. EXAM: CT ABDOMEN AND PELVIS WITHOUT CONTRAST TECHNIQUE: Multidetector CT imaging of the abdomen and pelvis was  performed following the standard protocol without IV contrast. COMPARISON:  12/20/2017.  Abdomen MRI dated 07/17/2017. FINDINGS: Lower chest: Stable mild scarring at the left lung base. Small to moderate sized pericardial effusion with an interval increase in size, currently measuring 11 mm in thickness and 6 Hounsfield units in density. Hepatobiliary: No focal liver abnormality is seen. Status post cholecystectomy. No biliary dilatation. Pancreas: Diffusely atrophied. The previously demonstrated 2.6 x 2.2 cm pancreatic tail mass currently measures 2.6 x 2.3 cm on image number 29 series 2. The previously demonstrated 2nd, smaller pancreatic tail mass is not clearly visible today without intravenous contrast. Spleen: Normal in size without focal abnormality. Adrenals/Urinary Tract: Stable severely atrophied right kidney with marked hydronephrosis. The previously demonstrated 1.8 cm anterior left renal mass is not well visualized without intravenous contrast, grossly unchanged. Unremarkable bladder and ureters. Stomach/Bowel: Mild sigmoid colon diverticulosis. Surgically absent appendix. Unremarkable stomach and small bowel. Vascular/Lymphatic: Atheromatous arterial calcifications without aneurysm. No enlarged lymph nodes. Celiac axis, superior mesenteric and left renal artery stents. Both the celiac axis and SMA stents have a crimped appearance, with marked narrowing of the SMA stent on the sagittal images and probable moderate narrowing of the celiac axis stent. Reproductive: Status post hysterectomy. No adnexal masses. Other: No abdominal  wall hernia or abnormality. No abdominopelvic ascites. Upper abdominal surgical clips on the right. Musculoskeletal: Moderate levoconvex thoracolumbar scoliosis and degenerative changes. IMPRESSION: 1. No acute abnormality. 2. Small to moderate-sized pericardial effusion with an interval increase in size. 3. Stable pancreatic tail cyst, currently measuring 2.6 x 2.3 cm. 4. Stable  severely atrophied right kidney with marked hydronephrosis, most likely from a chronic UPJ obstruction. 5. Grossly stable left renal mass, incompletely evaluated without intravenous contrast. 6. Crimped appearance of the celiac axis and SMA stents with luminal narrowing, as described above. Electronically Signed   By: Claudie Revering M.D.   On: 02/17/2018 11:24   US Abdomen Limited Ruq  Result Date: 02/18/2018 CLINICAL DATA:  Intractable nausea, vomiting EXAM: ULTRASOUND ABDOMEN LIMITED RIGHT UPPER QUADRANT COMPARISON:  02/17/2018 CT FINDINGS: Gallbladder: Prior cholecystectomy Common bile duct: Diameter: Dilated, 10 mm, likely related to post cholecystectomy state. Liver: No focal lesion identified. Within normal limits in parenchymal echogenicity. Portal vein is patent on color Doppler imaging with normal direction of blood flow towards the liver. Incidentally noted is severe right hydronephrosis as seen on CT. IMPRESSION: Prior cholecystectomy. Mildly prominent common bile duct likely related to patient's age and post cholecystectomy state. Severe right hydronephrosis as seen on CT. Electronically Signed   By: Rolm Baptise M.D.   On: 02/18/2018 13:55   ROS: GI please see H&P.  Her nausea is much better.  Denies change in bowel habits GI bleeding etc.            Blood pressure (!) 168/82, pulse 85, temperature 98.3 F (36.8 C), temperature source Oral, resp. rate 18, height 5' 4"  (1.626 m), weight 50.1 kg, SpO2 95 %.  Physical exam:   General--Pleasant white female in no distress alert and oriented ENT--nonicteric Neck--supple Heart--regular rate and rhythm without murmurs or gallops Lungs--clear Abdomen--nondistended, nontender with good bowel sounds.  There is a mass-effect in the right upper quadrant it is not particularly tender. Psych--alert and oriented mood is appropriate oriented   Assessment: 1.  Abnormal LFTs.  These have been intermittently abnormal for some time.  She does  have a history of severe hepatitis from amoxicillin which she did receive here in the hospital which is now been stopped.  Hopefully her LFTs will improve. 2.  Right upper quadrant fullness.  May have been stool nothing really seen on CT scan.  Done this admission. 3.  Chronic nausea.  She is previously been seen by Dr. Jerene Pitch.  I was able to review the records from [facility] and she had EGD 12/16 with the finding of a duodenal stricture possibly related to her prior gallbladder surgery and underwent a duodenal dilatation at that time.  She had subsequent EUS with biopsy of the pancreatic cyst and withdrawal of fluid and this was unchanged from prior studies and apparently was all benign according to the records.  She apparently was dilated to 14 mm 12/16 and according to their notes improved following that time. 4.  Significant mesenteric vascular disease/renal vascular disease with multiple stents  Plan: 1.  Suspect that her elevated liver test are due to the amoxicillin.  She has had a history of severe hepatitis in the past felt to be due to amoxicillin.  Her viral hepatitis markers are negative but we will go ahead and check autoimmune markers etc.  Would clearly avoid any further penicillin products. 2.  We will wait for the results of the MRCP done earlier today.   Nancy Fetter 02/19/2018, 8:07  AM   This note was created using voice recognition software and minor errors may Have occurred unintentionally. Pager: [phone] If no answer or after hours call [phone]

## 2018-02-19 NOTE — Progress Notes (Addendum)
PROGRESS NOTE    Brooke Walker  VFI:[id number] DOB: 10/15/1930 DOA: 02/17/2018 PCP: Caren Macadam, MD    Brief Narrative:  84 y.o.femalewith medical history significant ofadrenal cancer, right kidney stent x2 which was removed, mesenteric artery stenting x2, Was transferred from [facility] with complaints of intractable nausea and vomiting decreased appetite and weight loss. Patient reports she lost 15 pounds in the last 6 weeks. She absolutely has no appetite.  Reports that she sees Dr. Jerene Pitch at [facility] and he did EGD and colonoscopy which apparently was said to be normal per patient. Assessment & Plan:   Active Problems:   Abnormal LFTs   Intractable nausea and vomiting   Intractable vomiting   Hepatitis   Malnutrition of moderate degree   Worsening liver function tests: Differential include sec to antibiotics, specifically amoxicillin vs auto immune.  Her nausea, improved but not resolved.  Reviewed MRi of the abdomen with the patient.  No acute findings in liver , several cystic lesions in the pancreas . Recommend repeating the MRI of the abdomen to follow up on the cystic lesions.  Vomiting has improved and we have transitioned to regular diet, if she can tolerate without nausea and vomiting , and liver function tests improving, we will discharge her in am.  GI consulted and recommendations given. Further lab work up is pending.  scopolamine patch is helping.    Abnormal TSH: FREE t3 and free t4 ordered.     Moderate malnutrition:  Dietary consulted and recommendations given.    Left renal mass new since 2016: Would explain the recent weight loss, nausea, vomiting, .  Will request urology to follow up for biopsy and further evaluation.     Right severe hydronephrosis  Conservative management as per urology.    Recent UTI/ repeat urine cultures are negative.  No indication of antibiotics.    Dehydration: improving Encourage  oral intake.    Anemia of chronic disease; Hemoglobin around 11. Monitor.   Stage 2 Pressure ulcer on the buttocks Wound care to be consulted.   DVT prophylaxis: lovenox.  Code Status: DNR Family Communication: none at bedside.  Disposition Plan: pending clinical improvement.    Consultants:   Urology   Gastroenterology.    Procedures: MRI of the abdomen.    Antimicrobials: none.    Subjective: Persistent nausea, some abdominal discomfort.   Objective: Vitals:   02/18/18 2038 02/18/18 2113 02/19/18 0520 02/19/18 0737  BP: (!) 162/72 (!) 152/74 (!) 196/70 (!) 168/82  Pulse:   85   Resp:   18   Temp:   98.3 F (36.8 C)   TempSrc:   Oral   SpO2:   95%   Weight:      Height:        Intake/Output Summary (Last 24 hours) at 02/19/2018 1407 Last data filed at 02/19/2018 1000 Gross per 24 hour  Intake 2248.43 ml  Output 1300 ml  Net 948.43 ml   Filed Weights   02/17/18 1009 02/17/18 1519  Weight: 51.7 kg 50.1 kg    Examination:  General exam: Appears calm and comfortable  Respiratory system: Clear to auscultation. Respiratory effort normal. Cardiovascular system: S1 & S2 heard, RRR. No JVD, murmurs, Gastrointestinal system: Abdomen is nondistended, soft and nontender. No organomegaly or masses felt. Normal bowel sounds heard. Central nervous system: Alert and oriented. No focal neurological deficits. Extremities: Symmetric 5 x 5 power. Skin: No rashes, lesions or ulcers Psychiatry: l. Mood & affect  appropriate.     Data Reviewed: I have personally reviewed following labs and imaging studies  CBC: Recent Labs  Lab 02/17/18 1041  WBC 7.5  HGB 11.9*  HCT 36.5  MCV 94.8  PLT 035   Basic Metabolic Panel: Recent Labs  Lab 02/17/18 1041 02/18/18 0525 02/19/18 0501  NA 136 140 136  K 4.2 3.9 4.2  CL 100 105 106  CO2 24 26 23   GLUCOSE 108* 92 108*  BUN 20 21 12   CREATININE 1.01* 1.03* 0.82  CALCIUM 9.3 9.1 8.6*   GFR: Estimated Creatinine  Clearance: 39.7 mL/min (by C-G formula based on SCr of 0.82 mg/dL). Liver Function Tests: Recent Labs  Lab 02/17/18 1041 02/18/18 0525 02/19/18 0501  AST 242* 281* 287*  ALT 239* 246* 282*  ALKPHOS 195* 210* 219*  BILITOT 0.6 0.6 0.5  PROT 7.0 6.1* 6.0*  ALBUMIN 3.7 3.4* 3.1*   Recent Labs  Lab 02/17/18 1041  LIPASE 23   No results for input(s): AMMONIA in the last 168 hours. Coagulation Profile: Recent Labs  Lab 02/19/18 1035  INR 0.91   Cardiac Enzymes: No results for input(s): CKTOTAL, CKMB, CKMBINDEX, TROPONINI in the last 168 hours. BNP (last 3 results) No results for input(s): PROBNP in the last 8760 hours. HbA1C: No results for input(s): HGBA1C in the last 72 hours. CBG: No results for input(s): GLUCAP in the last 168 hours. Lipid Profile: No results for input(s): CHOL, HDL, LDLCALC, TRIG, CHOLHDL, LDLDIRECT in the last 72 hours. Thyroid Function Tests: Recent Labs    02/19/18 0501  TSH 29.435*   Anemia Panel: No results for input(s): VITAMINB12, FOLATE, FERRITIN, TIBC, IRON, RETICCTPCT in the last 72 hours. Sepsis Labs: No results for input(s): PROCALCITON, LATICACIDVEN in the last 168 hours.  No results found for this or any previous visit (from the past 240 hour(s)).       Radiology Studies: Mr 3d Recon At Scanner  Result Date: 02/19/2018 CLINICAL DATA:  Elevated liver function tests. Intractable nausea and vomiting. Borderline biliary ductal dilatation on recent ultrasound. EXAM: MRI ABDOMEN WITHOUT AND WITH CONTRAST (INCLUDING MRCP) TECHNIQUE: Multiplanar multisequence MR imaging of the abdomen was performed both before and after the administration of intravenous contrast. Heavily T2-weighted images of the biliary and pancreatic ducts were obtained, and three-dimensional MRCP images were rendered by post processing. CONTRAST:  5 mL Gadavist COMPARISON:  Ultrasound on 02/18/2018 and prior CT and MRI in June 2016 FINDINGS: Lower chest: No acute  findings. Hepatobiliary: No hepatic masses identified. Prior cholecystectomy. Mild diffuse biliary ductal dilatation with common bile duct measuring 11 mm. This is similar to previous CT on 11/17/2014. No evidence of choledocholithiasis or other obstructing etiology. Pancreas: A 2.4 x 2.2 cm simple appearing cystic lesion is seen in the pancreatic tail, and 2 other cysts are seen in the pancreatic tail measuring 8 mm and the pancreatic body measuring 9 mm. These are all stable compared to previous studies dating back to June 2016. There is no evidence of main pancreatic ductal dilatation or pancreas divisum. Spleen:  Within normal limits in size and appearance. Adrenals/Urinary Tract: Chronic diffuse right renal parenchymal atrophy is again demonstrated. There is persistent severe right hydronephrosis, which is new since prior MRI in March 2019. Transition zone is seen in the proximal right ureter. Urothelial neoplasm cannot be excluded. An enhancing mass is again seen in the anterior midpole the left kidney which measures 2.1 x 1.8 cm. This is new since 2016, and consistent with renal  cell carcinoma. Stomach/Bowel: Visualized portion unremarkable. Vascular/Lymphatic: No pathologically enlarged lymph nodes identified. No abdominal aortic aneurysm. Other:  None. Musculoskeletal:  No suspicious bone lesions identified. IMPRESSION: Prior cholecystectomy. Mild biliary ductal dilatation likely related to prior cholecystectomy. No choledocholithiasis or other obstructing etiology visualized. Several simple appearing cystic lesions in the pancreatic body and tail, largest measuring 2.4 cm, remain stable since 2016. Recommend continued follow-up by MRI in 1 year. This recommendation follows ACR consensus guidelines: Management of Incidental Pancreatic Cysts: A White Paper of the ACR Incidental Findings Committee. J Am Coll Radiol 0347;42:595-638. 2 cm solid enhancing mass in the left kidney is consistent with renal cell  carcinoma. Severe right hydronephrosis again seen with transition point in the proximal right ureter. Urothelial neoplasm cannot be excluded. Recommend urology referral for management. Electronically Signed   By: Earle Gell M.D.   On: 02/19/2018 08:16   Mr Abdomen Mrcp W Wo Contast  Result Date: 02/19/2018 CLINICAL DATA:  Elevated liver function tests. Intractable nausea and vomiting. Borderline biliary ductal dilatation on recent ultrasound. EXAM: MRI ABDOMEN WITHOUT AND WITH CONTRAST (INCLUDING MRCP) TECHNIQUE: Multiplanar multisequence MR imaging of the abdomen was performed both before and after the administration of intravenous contrast. Heavily T2-weighted images of the biliary and pancreatic ducts were obtained, and three-dimensional MRCP images were rendered by post processing. CONTRAST:  5 mL Gadavist COMPARISON:  Ultrasound on 02/18/2018 and prior CT and MRI in June 2016 FINDINGS: Lower chest: No acute findings. Hepatobiliary: No hepatic masses identified. Prior cholecystectomy. Mild diffuse biliary ductal dilatation with common bile duct measuring 11 mm. This is similar to previous CT on 11/17/2014. No evidence of choledocholithiasis or other obstructing etiology. Pancreas: A 2.4 x 2.2 cm simple appearing cystic lesion is seen in the pancreatic tail, and 2 other cysts are seen in the pancreatic tail measuring 8 mm and the pancreatic body measuring 9 mm. These are all stable compared to previous studies dating back to June 2016. There is no evidence of main pancreatic ductal dilatation or pancreas divisum. Spleen:  Within normal limits in size and appearance. Adrenals/Urinary Tract: Chronic diffuse right renal parenchymal atrophy is again demonstrated. There is persistent severe right hydronephrosis, which is new since prior MRI in March 2019. Transition zone is seen in the proximal right ureter. Urothelial neoplasm cannot be excluded. An enhancing mass is again seen in the anterior midpole the left  kidney which measures 2.1 x 1.8 cm. This is new since 2016, and consistent with renal cell carcinoma. Stomach/Bowel: Visualized portion unremarkable. Vascular/Lymphatic: No pathologically enlarged lymph nodes identified. No abdominal aortic aneurysm. Other:  None. Musculoskeletal:  No suspicious bone lesions identified. IMPRESSION: Prior cholecystectomy. Mild biliary ductal dilatation likely related to prior cholecystectomy. No choledocholithiasis or other obstructing etiology visualized. Several simple appearing cystic lesions in the pancreatic body and tail, largest measuring 2.4 cm, remain stable since 2016. Recommend continued follow-up by MRI in 1 year. This recommendation follows ACR consensus guidelines: Management of Incidental Pancreatic Cysts: A White Paper of the ACR Incidental Findings Committee. J Am Coll Radiol 7564;33:295-188. 2 cm solid enhancing mass in the left kidney is consistent with renal cell carcinoma. Severe right hydronephrosis again seen with transition point in the proximal right ureter. Urothelial neoplasm cannot be excluded. Recommend urology referral for management. Electronically Signed   By: Earle Gell M.D.   On: 02/19/2018 08:16   US Abdomen Limited Ruq  Result Date: 02/18/2018 CLINICAL DATA:  Intractable nausea, vomiting EXAM: ULTRASOUND ABDOMEN LIMITED RIGHT UPPER QUADRANT  COMPARISON:  02/17/2018 CT FINDINGS: Gallbladder: Prior cholecystectomy Common bile duct: Diameter: Dilated, 10 mm, likely related to post cholecystectomy state. Liver: No focal lesion identified. Within normal limits in parenchymal echogenicity. Portal vein is patent on color Doppler imaging with normal direction of blood flow towards the liver. Incidentally noted is severe right hydronephrosis as seen on CT. IMPRESSION: Prior cholecystectomy. Mildly prominent common bile duct likely related to patient's age and post cholecystectomy state. Severe right hydronephrosis as seen on CT. Electronically Signed    By: Rolm Baptise M.D.   On: 02/18/2018 13:55        Scheduled Meds: . amLODipine  10 mg Oral Daily  . aspirin EC  81 mg Oral Daily  . clopidogrel  75 mg Oral Daily  . enoxaparin (LOVENOX) injection  40 mg Subcutaneous Q24H  . feeding supplement (ENSURE ENLIVE)  237 mL Oral BID BM  . hydrALAZINE  100 mg Oral TID  . levothyroxine  112 mcg Oral QAC breakfast  . multivitamin with minerals  1 tablet Oral Daily  . pantoprazole  40 mg Oral Daily  . saccharomyces boulardii  250 mg Oral BID  . scopolamine  1 patch Transdermal Q72H   Continuous Infusions:   LOS: 1 day    Time spent: 35 minutes.     Hosie Poisson, MD [facility] Pager [phone]  If 7PM-7AM, please contact night-coverage [email or web address] Password [email or web address] 02/19/2018, 2:07 PM

## 2018-02-20 LAB — COMPREHENSIVE METABOLIC PANEL
ALT: 196 U/L — ABNORMAL HIGH (ref 0–44)
AST: 141 U/L — ABNORMAL HIGH (ref 15–41)
Albumin: 2.9 g/dL — ABNORMAL LOW (ref 3.5–5.0)
Alkaline Phosphatase: 199 U/L — ABNORMAL HIGH (ref 38–126)
Anion gap: 10 (ref 5–15)
BUN: 10 mg/dL (ref 8–23)
CHLORIDE: 106 mmol/L (ref 98–111)
CO2: 23 mmol/L (ref 22–32)
Calcium: 8.6 mg/dL — ABNORMAL LOW (ref 8.9–10.3)
Creatinine, Ser: 0.79 mg/dL (ref 0.44–1.00)
GFR calc non Af Amer: >60 mL/min (ref >60)
Glucose, Bld: 96 mg/dL (ref 70–99)
POTASSIUM: 3.7 mmol/L (ref 3.5–5.1)
Sodium: 139 mmol/L (ref 135–145)
Total Bilirubin: 0.3 mg/dL (ref 0.3–1.2)
Total Protein: 5.7 g/dL — ABNORMAL LOW (ref 6.5–8.1)

## 2018-02-20 LAB — T3, FREE: T3 FREE: 1.8 pg/mL — AB (ref 2.0–4.4)

## 2018-02-20 MED ORDER — LEVOTHYROXINE SODIUM 25 MCG PO TABS
125.0000 ug | ORAL_TABLET | Freq: Every day | ORAL | Status: DC
  Administered 2018-02-21: 125 ug via ORAL
  Filled 2018-02-20: qty 1

## 2018-02-20 MED ORDER — SCOPOLAMINE 1 MG/3DAYS TD PT72: 1.0000 | MEDICATED_PATCH | TRANSDERMAL | Status: DC

## 2018-02-20 MED ORDER — CARVEDILOL 6.25 MG PO TABS
6.2500 mg | ORAL_TABLET | Freq: Two times a day (BID) | ORAL | Status: DC
  Administered 2018-02-20 – 2018-02-21 (×4): 6.25 mg via ORAL
  Filled 2018-02-20 (×4): qty 1

## 2018-02-20 MED ORDER — ONDANSETRON HCL 4 MG/2ML IJ SOLN
4.0000 mg | Freq: Three times a day (TID) | INTRAMUSCULAR | Status: DC | PRN
  Administered 2018-02-20: 4 mg via INTRAVENOUS
  Filled 2018-02-20: qty 2

## 2018-02-20 NOTE — Progress Notes (Addendum)
[facility] PROGRESS NOTE  Brooke Walker MPN:[id number] DOB: 04/02/1931 DOA: 02/17/2018 PCP: Caren Macadam, MD  Brief summary   84 y.o.femalewith medical history significant ofadrenal cancer, arterial stenosis with mesenteric artery stenting x2, right hydronephrosis stent x 2 which was removed, recent e coli/uti treated with Augmentin, was transferred from [facility] with complaints of intractable nausea and vomiting decreased appetite and weight loss, found to have abnormal LFTs.Patient reports she lost 15 pounds in the last 6 weeks. She absolutely has no appetite.  Reports that she sees Dr. Jerene Pitch at [facility] and he did EGD and colonoscopy which apparently was said to be normal per patient.  Assessment/Plan:  Abnormal LFTs. Nausea, vomiting. Mild acute hepatitis. Thought due to recent antibiotic use/augmentin (Pt reports h/o med induced hepatitis in the past due to amoxicillin). Reviewed MRi of the abdomen with the patient. no acute findings in liver , several cystic lesions in the pancreas. Recommend repeating the MRI of the abdomen to follow up on the cystic lesions.  -continuing to improve with supportive care, GI consulted and ordered further lab work up is pending.  scopolamine patch is helping.    Hypothyroidism. Elevated TSH.adjusted levothyroxine from 1125->125. Repeat TFTs in 3-4 weeks   Moderate malnutrition: Dietary consulted and recommendations given. Recommended to outpatient f/u  Left renal mass new since 2016: Pt is seen by urology, recommended outpatient follow up  Right severe hydronephrosis, thought non obstructive per urology. Conservative management as per urology.  Recent UTI/ repeat urine cultures are negative. No indication of antibiotics.  Dehydration: improving. Encourage oral intake.  Anemia of chronic disease; Hemoglobin around 11. Monitor.  HTN. Uncontrolled. Resumed amlodipine, hydralazine on admission. Pt is on coreg as well.  Resumed. titrate as needed  Code Status: ful Family Communication: d/w patient, RN (indicate person spoken with, relationship, and if by phone, the number) Disposition Plan: home 24-48 hrs    Consultants:  GI  Procedures:  none  Antibiotics:  none (indicate start date, and stop date if known)  HPI/Subjective: Alert. No distress. No vomiting today.   Objective: Vitals:   02/20/18 0451 02/20/18 0913  BP: 120/70 (!) 197/65  Pulse:  90  Resp:    Temp:    SpO2:     No intake or output data in the 24 hours ending 02/20/18 1038 Filed Weights   02/17/18 1009 02/17/18 1519  Weight: 51.7 kg 50.1 kg    Exam:   General:  No distress   Cardiovascular: s1,s2 rrr  Respiratory: CTA BL  Abdomen: soft, nt   Musculoskeletal: no leg edema    Data Reviewed: Basic Metabolic Panel: Recent Labs  Lab 02/17/18 1041 02/18/18 0525 02/19/18 0501 02/20/18 0536  NA 136 140 136 139  K 4.2 3.9 4.2 3.7  CL 100 105 106 106  CO2 24 26 23 23   GLUCOSE 108* 92 108* 96  BUN 20 21 12 10   CREATININE 1.01* 1.03* 0.82 0.79  CALCIUM 9.3 9.1 8.6* 8.6*   Liver Function Tests: Recent Labs  Lab 02/17/18 1041 02/18/18 0525 02/19/18 0501 02/20/18 0536  AST 242* 281* 287* 141*  ALT 239* 246* 282* 196*  ALKPHOS 195* 210* 219* 199*  BILITOT 0.6 0.6 0.5 0.3  PROT 7.0 6.1* 6.0* 5.7*  ALBUMIN 3.7 3.4* 3.1* 2.9*   Recent Labs  Lab 02/17/18 1041  LIPASE 23   No results for input(s): AMMONIA in the last 168 hours. CBC: Recent Labs  Lab 02/17/18 1041  WBC 7.5  HGB 11.9*  HCT 36.5  MCV 94.8  PLT 159   Cardiac Enzymes: No results for input(s): CKTOTAL, CKMB, CKMBINDEX, TROPONINI in the last 168 hours. BNP (last 3 results) No results for input(s): BNP in the last 8760 hours.  ProBNP (last 3 results) No results for input(s): PROBNP in the last 8760 hours.  CBG: No results for input(s): GLUCAP in the last 168 hours.  Recent Results (from the past 240 hour(s))  Culture,  Urine     Status: None   Collection Time: 02/18/18 12:53 PM  Result Value Ref Range Status   Specimen Description   Final    URINE, RANDOM Performed at [facility] [address], [place], [place] [address]    Special Requests   Final    NONE Performed at [facility], [place] [address], [place], [place] [address]    Culture   Final    NO GROWTH Performed at [facility], [place] [address] [place], [place] [address]    Report Status 02/19/2018 FINAL  Final     Studies: Mr 3d Recon At Scanner  Result Date: 02/19/2018 CLINICAL DATA:  Elevated liver function tests. Intractable nausea and vomiting. Borderline biliary ductal dilatation on recent ultrasound. EXAM: MRI ABDOMEN WITHOUT AND WITH CONTRAST (INCLUDING MRCP) TECHNIQUE: Multiplanar multisequence MR imaging of the abdomen was performed both before and after the administration of intravenous contrast. Heavily T2-weighted images of the biliary and pancreatic ducts were obtained, and three-dimensional MRCP images were rendered by post processing. CONTRAST:  5 mL Gadavist COMPARISON:  Ultrasound on 02/18/2018 and prior CT and MRI in June 2016 FINDINGS: Lower chest: No acute findings. Hepatobiliary: No hepatic masses identified. Prior cholecystectomy. Mild diffuse biliary ductal dilatation with common bile duct measuring 11 mm. This is similar to previous CT on 11/17/2014. No evidence of choledocholithiasis or other obstructing etiology. Pancreas: A 2.4 x 2.2 cm simple appearing cystic lesion is seen in the pancreatic tail, and 2 other cysts are seen in the pancreatic tail measuring 8 mm and the pancreatic body measuring 9 mm. These are all stable compared to previous studies dating back to June 2016. There is no evidence of main pancreatic ductal dilatation or pancreas divisum. Spleen:  Within normal limits in size and appearance. Adrenals/Urinary Tract: Chronic diffuse right renal parenchymal atrophy is  again demonstrated. There is persistent severe right hydronephrosis, which is new since prior MRI in March 2019. Transition zone is seen in the proximal right ureter. Urothelial neoplasm cannot be excluded. An enhancing mass is again seen in the anterior midpole the left kidney which measures 2.1 x 1.8 cm. This is new since 2016, and consistent with renal cell carcinoma. Stomach/Bowel: Visualized portion unremarkable. Vascular/Lymphatic: No pathologically enlarged lymph nodes identified. No abdominal aortic aneurysm. Other:  None. Musculoskeletal:  No suspicious bone lesions identified. IMPRESSION: Prior cholecystectomy. Mild biliary ductal dilatation likely related to prior cholecystectomy. No choledocholithiasis or other obstructing etiology visualized. Several simple appearing cystic lesions in the pancreatic body and tail, largest measuring 2.4 cm, remain stable since 2016. Recommend continued follow-up by MRI in 1 year. This recommendation follows ACR consensus guidelines: Management of Incidental Pancreatic Cysts: A White Paper of the ACR Incidental Findings Committee. J Am Coll Radiol 5188;41:660-630. 2 cm solid enhancing mass in the left kidney is consistent with renal cell carcinoma. Severe right hydronephrosis again seen with transition point in the proximal right ureter. Urothelial neoplasm cannot be excluded. Recommend urology referral for management. Electronically Signed  By: Earle Gell M.D.   On: 02/19/2018 08:16   Mr Abdomen Mrcp W Wo Contast  Result Date: 02/19/2018 CLINICAL DATA:  Elevated liver function tests. Intractable nausea and vomiting. Borderline biliary ductal dilatation on recent ultrasound. EXAM: MRI ABDOMEN WITHOUT AND WITH CONTRAST (INCLUDING MRCP) TECHNIQUE: Multiplanar multisequence MR imaging of the abdomen was performed both before and after the administration of intravenous contrast. Heavily T2-weighted images of the biliary and pancreatic ducts were obtained, and  three-dimensional MRCP images were rendered by post processing. CONTRAST:  5 mL Gadavist COMPARISON:  Ultrasound on 02/18/2018 and prior CT and MRI in June 2016 FINDINGS: Lower chest: No acute findings. Hepatobiliary: No hepatic masses identified. Prior cholecystectomy. Mild diffuse biliary ductal dilatation with common bile duct measuring 11 mm. This is similar to previous CT on 11/17/2014. No evidence of choledocholithiasis or other obstructing etiology. Pancreas: A 2.4 x 2.2 cm simple appearing cystic lesion is seen in the pancreatic tail, and 2 other cysts are seen in the pancreatic tail measuring 8 mm and the pancreatic body measuring 9 mm. These are all stable compared to previous studies dating back to June 2016. There is no evidence of main pancreatic ductal dilatation or pancreas divisum. Spleen:  Within normal limits in size and appearance. Adrenals/Urinary Tract: Chronic diffuse right renal parenchymal atrophy is again demonstrated. There is persistent severe right hydronephrosis, which is new since prior MRI in March 2019. Transition zone is seen in the proximal right ureter. Urothelial neoplasm cannot be excluded. An enhancing mass is again seen in the anterior midpole the left kidney which measures 2.1 x 1.8 cm. This is new since 2016, and consistent with renal cell carcinoma. Stomach/Bowel: Visualized portion unremarkable. Vascular/Lymphatic: No pathologically enlarged lymph nodes identified. No abdominal aortic aneurysm. Other:  None. Musculoskeletal:  No suspicious bone lesions identified. IMPRESSION: Prior cholecystectomy. Mild biliary ductal dilatation likely related to prior cholecystectomy. No choledocholithiasis or other obstructing etiology visualized. Several simple appearing cystic lesions in the pancreatic body and tail, largest measuring 2.4 cm, remain stable since 2016. Recommend continued follow-up by MRI in 1 year. This recommendation follows ACR consensus guidelines: Management of  Incidental Pancreatic Cysts: A White Paper of the ACR Incidental Findings Committee. J Am Coll Radiol 0867;61:950-932. 2 cm solid enhancing mass in the left kidney is consistent with renal cell carcinoma. Severe right hydronephrosis again seen with transition point in the proximal right ureter. Urothelial neoplasm cannot be excluded. Recommend urology referral for management. Electronically Signed   By: Earle Gell M.D.   On: 02/19/2018 08:16   US Abdomen Limited Ruq  Result Date: 02/18/2018 CLINICAL DATA:  Intractable nausea, vomiting EXAM: ULTRASOUND ABDOMEN LIMITED RIGHT UPPER QUADRANT COMPARISON:  02/17/2018 CT FINDINGS: Gallbladder: Prior cholecystectomy Common bile duct: Diameter: Dilated, 10 mm, likely related to post cholecystectomy state. Liver: No focal lesion identified. Within normal limits in parenchymal echogenicity. Portal vein is patent on color Doppler imaging with normal direction of blood flow towards the liver. Incidentally noted is severe right hydronephrosis as seen on CT. IMPRESSION: Prior cholecystectomy. Mildly prominent common bile duct likely related to patient's age and post cholecystectomy state. Severe right hydronephrosis as seen on CT. Electronically Signed   By: Rolm Baptise M.D.   On: 02/18/2018 13:55    Scheduled Meds: . amLODipine  10 mg Oral Daily  . aspirin EC  81 mg Oral Daily  . clopidogrel  75 mg Oral Daily  . enoxaparin (LOVENOX) injection  40 mg Subcutaneous Q24H  . feeding supplement (  ENSURE ENLIVE)  237 mL Oral BID BM  . hydrALAZINE  100 mg Oral TID  . levothyroxine  112 mcg Oral QAC breakfast  . multivitamin with minerals  1 tablet Oral Daily  . pantoprazole  40 mg Oral Daily  . saccharomyces boulardii  250 mg Oral BID  . scopolamine  1 patch Transdermal Q72H   Continuous Infusions:  Active Problems:   Abnormal LFTs   Intractable nausea and vomiting   Intractable vomiting   Hepatitis   Pressure injury of skin   Malnutrition of moderate  degree    Time spent: >35 minutes     Kinnie Feil  [facility] Pager [phone]. If 7PM-7AM, please contact night-coverage at [email or web address], password [facility] 02/20/2018, 10:38 AM  LOS: 2 days

## 2018-02-20 NOTE — Progress Notes (Signed)
Patient sleeping did not wake up.LFTs slightly better this morning.  Off of amoxicillin.  Will check ANA, autoimmune markers etc. in the morning would suggest following LFTs.  Hopefully will continue to improve with holding the amoxicillin.

## 2018-02-21 DIAGNOSIS — K759 Inflammatory liver disease, unspecified: Secondary | ICD-10-CM

## 2018-02-21 DIAGNOSIS — R112 Nausea with vomiting, unspecified: Secondary | ICD-10-CM

## 2018-02-21 DIAGNOSIS — E440 Moderate protein-calorie malnutrition: Secondary | ICD-10-CM

## 2018-02-21 DIAGNOSIS — R945 Abnormal results of liver function studies: Secondary | ICD-10-CM

## 2018-02-21 LAB — COMPREHENSIVE METABOLIC PANEL
ALK PHOS: 190 U/L — AB (ref 38–126)
ALT: 145 U/L — ABNORMAL HIGH (ref 0–44)
AST: 87 U/L — ABNORMAL HIGH (ref 15–41)
Albumin: 3.1 g/dL — ABNORMAL LOW (ref 3.5–5.0)
Anion gap: 9 (ref 5–15)
BILIRUBIN TOTAL: 0.3 mg/dL (ref 0.3–1.2)
BUN: 14 mg/dL (ref 8–23)
CALCIUM: 8.7 mg/dL — AB (ref 8.9–10.3)
CO2: 23 mmol/L (ref 22–32)
Chloride: 105 mmol/L (ref 98–111)
Creatinine, Ser: 0.98 mg/dL (ref 0.44–1.00)
GFR calc non Af Amer: 51 mL/min — ABNORMAL LOW (ref >60)
GFR, EST AFRICAN AMERICAN: 59 mL/min — AB (ref >60)
Glucose, Bld: 87 mg/dL (ref 70–99)
POTASSIUM: 4.1 mmol/L (ref 3.5–5.1)
SODIUM: 137 mmol/L (ref 135–145)
TOTAL PROTEIN: 6.2 g/dL — AB (ref 6.5–8.1)

## 2018-02-21 LAB — CBC WITH DIFFERENTIAL/PLATELET
ABS IMMATURE GRANULOCYTES: 0.02 10*3/uL (ref 0.00–0.07)
BASOS ABS: 0 10*3/uL (ref 0.0–0.1)
BASOS PCT: 1 %
Eosinophils Absolute: 0.3 10*3/uL (ref 0.0–0.5)
Eosinophils Relative: 5 %
HCT: 32.6 % — ABNORMAL LOW (ref 36.0–46.0)
Hemoglobin: 10.3 g/dL — ABNORMAL LOW (ref 12.0–15.0)
IMMATURE GRANULOCYTES: 0 %
Lymphocytes Relative: 31 %
Lymphs Abs: 2 10*3/uL (ref 0.7–4.0)
MCH: 30.8 pg (ref 26.0–34.0)
MCHC: 31.6 g/dL (ref 30.0–36.0)
MCV: 97.6 fL (ref 80.0–100.0)
Monocytes Absolute: 0.7 10*3/uL (ref 0.1–1.0)
Monocytes Relative: 11 %
NEUTROS ABS: 3.2 10*3/uL (ref 1.7–7.7)
NEUTROS PCT: 52 %
PLATELETS: 121 10*3/uL — AB (ref 150–400)
RBC: 3.34 MIL/uL — AB (ref 3.87–5.11)
RDW: 13.9 % (ref 11.5–15.5)
WBC: 6.2 10*3/uL (ref 4.0–10.5)
nRBC: 0 % (ref 0.0–0.2)

## 2018-02-21 MED ORDER — LEVOTHYROXINE SODIUM 125 MCG PO TABS: 125.0000 ug | ORAL_TABLET | Freq: Every day | ORAL | 0 refills | Status: AC

## 2018-02-21 MED ORDER — ENSURE ENLIVE PO LIQD: 237.0000 mL | Freq: Two times a day (BID) | ORAL | 12 refills | Status: AC

## 2018-02-21 MED ORDER — SACCHAROMYCES BOULARDII 250 MG PO CAPS: 250.0000 mg | ORAL_CAPSULE | Freq: Two times a day (BID) | ORAL | 0 refills | Status: AC

## 2018-02-21 MED ORDER — PRAVASTATIN SODIUM 40 MG PO TABS: 40.0000 mg | ORAL_TABLET | Freq: Every day | ORAL | 0 refills | Status: AC

## 2018-02-21 MED ORDER — HYDROCORTISONE 1 % EX CREA: TOPICAL_CREAM | Freq: Three times a day (TID) | CUTANEOUS | 0 refills | Status: AC | PRN

## 2018-02-21 MED ORDER — PANTOPRAZOLE SODIUM 40 MG PO TBEC: 40.0000 mg | DELAYED_RELEASE_TABLET | Freq: Every day | ORAL | 0 refills | Status: AC

## 2018-02-21 MED ORDER — ADULT MULTIVITAMIN W/MINERALS CH: 1.0000 | ORAL_TABLET | Freq: Every day | ORAL | 0 refills | Status: DC

## 2018-02-21 MED ORDER — SCOPOLAMINE 1 MG/3DAYS TD PT72: 1.0000 | MEDICATED_PATCH | TRANSDERMAL | 0 refills | Status: AC

## 2018-02-21 MED ORDER — CARVEDILOL 6.25 MG PO TABS: 6.2500 mg | ORAL_TABLET | Freq: Two times a day (BID) | ORAL | 0 refills | Status: AC

## 2018-02-21 NOTE — Progress Notes (Signed)
OT Cancellation Note  Patient Details Name: Brooke Walker MRN: [id number] DOB: Jun 27, 1930   Cancelled Treatment:    Reason Eval/Treat Not Completed: OT screened, no needs identified, will sign off. Pt was mod I with PT and doesn't have any OT needs. Will sign off  Wes Lezotte 02/21/2018, 3:20 PM  Lesle Chris, OTR/L Acute Rehabilitation Services [phone] [facility] pager [phone] office 02/21/2018

## 2018-02-21 NOTE — Evaluation (Signed)
Physical Therapy Evaluation-1x Patient Details Name: Brooke Walker MRN: [id number] DOB: December 05, 1930 Today's Date: 02/21/2018   History of Present Illness  84 yo female admitted with abnormal LFTs. CT (+) L renal mass. Hx of TIA, adrenal ca, neuropathy, PAD, CAD, CKD  Clinical Impression  Mod Ind with mobility. No PT needs. 1x eval. Will sign off.     Follow Up Recommendations No PT follow up    Equipment Recommendations  None recommended by PT    Recommendations for Other Services       Precautions / Restrictions Precautions Precautions: Fall Restrictions Weight Bearing Restrictions: No      Mobility  Bed Mobility               General bed mobility comments: oob in recliner  Transfers Overall transfer level: Modified independent                  Ambulation/Gait Ambulation/Gait assistance: Modified independent (Device/Increase time) Gait Distance (Feet): 250 Feet Assistive device: None Gait Pattern/deviations: Drifts right/left;Step-through pattern     General Gait Details: intermittent drifting noted. No LOB.   Stairs            Wheelchair Mobility    Modified Rankin (Stroke Patients Only)       Balance Overall balance assessment: Mild deficits observed, not formally tested                                           Pertinent Vitals/Pain Pain Assessment: No/denies pain    Home Living Family/patient expects to be discharged to:: Private residence Living Arrangements: Alone   Type of Home: Independent living facility Home Access: Level entry     Home Layout: One level Home Equipment: None      Prior Function Level of Independence: Independent               Hand Dominance        Extremity/Trunk Assessment   Upper Extremity Assessment Upper Extremity Assessment: Overall WFL for tasks assessed    Lower Extremity Assessment Lower Extremity Assessment: Overall WFL for tasks assessed(hx of  neuropathy)    Cervical / Trunk Assessment Cervical / Trunk Assessment: Normal  Communication   Communication: No difficulties  Cognition Arousal/Alertness: Awake/alert Behavior During Therapy: WFL for tasks assessed/performed Overall Cognitive Status: Within Functional Limits for tasks assessed                                        General Comments      Exercises     Assessment/Plan    PT Assessment Patent does not need any further PT services  PT Problem List         PT Treatment Interventions      PT Goals (Current goals can be found in the Care Plan section)  Acute Rehab PT Goals Patient Stated Goal: home PT Goal Formulation: All assessment and education complete, DC therapy    Frequency     Barriers to discharge        Co-evaluation               AM-PAC PT "6 Clicks" Daily Activity  Outcome Measure Difficulty turning over in bed (including adjusting bedclothes, sheets and blankets)?: None Difficulty moving from lying on back to  sitting on the side of the bed? : None Difficulty sitting down on and standing up from a chair with arms (e.g., wheelchair, bedside commode, etc,.)?: None Help needed moving to and from a bed to chair (including a wheelchair)?: None Help needed walking in hospital room?: None Help needed climbing 3-5 steps with a railing? : None 6 Click Score: 24    End of Session   Activity Tolerance: Patient tolerated treatment well Patient left: in chair;with call bell/phone within reach        Time: 6282-4175 PT Time Calculation (min) (ACUTE ONLY): 8 min   Charges:   PT Evaluation $PT Eval Low Complexity: [place], PT Acute Rehabilitation Services Pager: [phone] Office: [phone]

## 2018-02-21 NOTE — Progress Notes (Signed)
[facility] PROGRESS NOTE Subjective Patient feels much better.  Appears that she was required to be on antibiotics for an extended period of time took Cipro for some time unable to take Septra was started on amoxicillin here.  She apparently is allergic to amoxicillin while living in [place] amoxicillin caused severe hepatitis.  After taking here in the hospital she developed elevated liver test and have slowly improved off the amoxicillin.  Her nausea and anorexia slowly improved as well.  They have been going on for some time and the initial feeling was that they had been due to some sort of chronic UTI.  Urinalysis here showed bacteria but no WBCs in the urine.  Objective: Vital signs in last 24 hours: Temp:  [97.8 F (36.6 C)-98.8 F (37.1 C)] 98.8 F (37.1 C) (10/09 0513) Pulse Rate:  [74-92] 74 (10/09 0513) Resp:  [14-16] 16 (10/09 0513) BP: (150-197)/(60-96) 169/61 (10/09 0513) SpO2:  [95 %-96 %] 95 % (10/09 0513) Last BM Date: 02/19/18  Intake/Output from previous day: 10/08 0701 - 10/09 0700 In: 480 [P.O.:480] Out: 200 [Urine:200] Intake/Output this shift: No intake/output data recorded.    Lab Results: No results for input(s): WBC, HGB, HCT, PLT in the last 72 hours. BMET Recent Labs    02/19/18 0501 02/20/18 0536 02/21/18 0527  NA 136 139 137  K 4.2 3.7 4.1  CL 106 106 105  CO2 23 23 23   CREATININE 0.82 0.79 0.98   LFT Recent Labs    02/19/18 0501 02/20/18 0536 02/21/18 0527  PROT 6.0* 5.7* 6.2*  AST 287* 141* 87*  ALT 282* 196* 145*  ALKPHOS 219* 199* 190*  BILITOT 0.5 0.3 0.3   PT/INR Recent Labs    02/19/18 1035  LABPROT 12.2  INR 0.91   PANCREAS No results for input(s): LIPASE in the last 72 hours.       Studies/Results: No results found.  Medications: I have reviewed the patient's current medications.  Assessment:   1.  Abnormal LFTs.  Several other studies for autoimmune liver disease are pending but patient has  had this before with amoxicillin and her LFTs are steadily improving off of the amoxicillin.  This is probably due to drug-induced hepatitis. 2.  Anorexia and nausea.  Etiology unclear but has been presumed to be due to chronic UTI and has gotten better after the UTI was treated 3.  Chronic UTI.  Has been followed by urology need input from him as to where to go from here. 4.  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 Jerene Pitch with EUS and biopsy.  Apparently felt to be benign   Plan: From our viewpoint, patient should be okay to discharge.  Her LFTs are improving.  It would be reasonable to check them again in several weeks just to make sure that they have come back to baseline. We will sign off but be available if she has any further problems please give [place] a call.   Nancy Fetter 02/21/2018, 8:02 AM  This note was created using voice recognition software. Minor errors may Have occurred unintentionally.  Pager: [phone] If no answer or after hours call [phone]

## 2018-02-21 NOTE — Discharge Summary (Addendum)
Physician Discharge Summary  KRITIKA STUKES MVH:[id number] DOB: 1930-05-17 DOA: 02/17/2018  PCP: Caren Macadam, MD  Admit date: 02/17/2018 Discharge date: 02/21/2018  Admitted From: Home Disposition: Home  Recommendations for Outpatient Follow-up:  1. Follow up with PCP in 1-2 weeks 2. Follow up with Urology within 1-2 weeks 3. Follow up with Cardiology for continued monitoring of Pericardial Effusion 4. With gastroenterology in outpatient setting 5. Please obtain CMP/CBC, Mag, Phos in one week repeat liver function tests 6. Please follow up on the following pending results:  Home Health: No Equipment/Devices: None Recommended by PT  Discharge Condition: Stable CODE STATUS: DO NOT RESUSCITATE Diet recommendation: Heart Healthy Diet  Brief/Interim Summary: The patient is an 84 y.o.femalewith medical history significant ofadrenal cancer, arterial stenosis with mesenteric artery stenting x2, right hydronephrosis stent x 2 which was removed, recent e coli/uti treated with Augmentin, was transferred from [facility] with complaints of intractable nausea and vomiting, decreased appetite, and weight loss, found to have abnormal LFTs.Patient reports she lost 15 pounds in the last 6 weeks. She absolutely had no appetite.  Reports that she sees Dr. Jerene Pitch at [facility] and he did EGD and colonoscopy which apparently was said to be normal per patient.  Gastroenterology was consulted for further evaluation recommendations felt the patient had a drug-induced hepatitis.  Her LFTs slightly improved as was her nausea with symptomatic treatment.  She is continued on a scopolamine patch at discharge and she is to follow-up with primary care physician, urology, as well as gastroenterology and cardiology in outpatient setting is been deemed medically stable.  Discharge Diagnoses:  Active Problems:   Abnormal LFTs   Intractable nausea and vomiting   Intractable vomiting   Hepatitis    Pressure injury of skin   Malnutrition of moderate degree  Abnormal LFTs. Nausea, vomiting. Mild acute hepatitis. -Thought due to recent antibiotic use/augmentin (Pt reports h/o med induced hepatitis in the past due to amoxicillin). MRI of the abdomen done and showed several cystic lesions in the pancreas.  -Recommend repeating the MRI of the abdomen to follow up on the cystic lesions.  -Continuing to improve with supportive care, GI consulted and ordered further lab work up is pending. Will need to follow up on Further studies ordered by GI including ANA and Autoimmune markers  -Scopolamine patch is helping and Nausea is resolved -LFTs have been trending down and will need to repeat within 1 week -Recommending following up with outpatient GI and PCP.  Will need further work-up for her anorexia and nausea as an outpatient setting.  Hypothyroidism.  -Elevated TSH at 29.435, Regular Free T4 at 0.93 and low T3 at 1.8 -Levothyroxine adjusted from 112->125. -Repeat TFTs in 3-4 weeks to evaluate response  Moderate malnutrition -Dietary consulted and recommendations given. Recommended to outpatient f/u with [facility]rion   Left renal mass  -new since 2016 -Pt is seen by urology, recommended outpatient follow up  -Patient states that if this is Renal Cell Carcinoma will not want treatment   Right Severe Hydronephrosis -Thought non obstructive per urology. Conservative management as per urology.   Recent UTI -Repeat urine cultures are negative. No indication of antibiotics at this time -Follow up as an outpatient    Dehydration -Improving. Encourage oral intake and patient tolerating today  Normocytic Anemia/Anemia of Chronic Disease -Hb/Hct went from 11.9/36.5 -> 10.3/32.6 -No S/Sx of Bleeding -Continue to Monitor CBC as an Oupatient   HTN -Uncontrolled but resumed all home medications; BP was 170/62  this AM -Titrate accordingly and follow up with PCP  Pericardial Effusion -Small on  ECHO and not causing hemodynamic compromise -Follow up with Cardiology for continued monitoring  GERD -Continue with Protonix at discharge  History of carotid stenosis and TIAs -Continue with home Clopidogrel  Moisture associated stage II pressure injury to the buttocks -WOC nurse consulted recommending barrier cream twice daily and as needed Sowles changes. -Follow up on an outpatient and have written patient for donut cushion  Discharge Instructions Discharge Instructions    Call MD for:  difficulty breathing, headache or visual disturbances   Complete by:  As directed    Call MD for:  extreme fatigue   Complete by:  As directed    Call MD for:  hives   Complete by:  As directed    Call MD for:  persistant dizziness or light-headedness   Complete by:  As directed    Call MD for:  persistant nausea and vomiting   Complete by:  As directed    Call MD for:  redness, tenderness, or signs of infection (pain, swelling, redness, odor or green/yellow discharge around incision site)   Complete by:  As directed    Call MD for:  severe uncontrolled pain   Complete by:  As directed    Call MD for:  temperature >100.4   Complete by:  As directed    Diet - low sodium heart healthy   Complete by:  As directed    Discharge instructions   Complete by:  As directed    You were cared for by a hospitalist during your hospital stay. If you have any questions about your discharge medications or the care you received while you were in the hospital after you are discharged, you can call the unit and ask to speak with the hospitalist on call if the hospitalist that took care of you is not available. Once you are discharged, your primary care physician will handle any further medical issues. Please note that NO REFILLS for any discharge medications will be authorized once you are discharged, as it is imperative that you return to your primary care physician (or establish a relationship with a primary care  physician if you do not have one) for your aftercare needs so that they can reassess your need for medications and monitor your lab values.  Follow up with PCP, Cardiology, and Urology in the outpatient setting. Take all medications as prescribed. If symptoms change or worsen please return to the ED for evaluation   Increase activity slowly   Complete by:  As directed      Allergies as of 02/21/2018      Reactions   Amoxicillin    Other reaction(s): Other (See Comments) Drug-induced hepatitis (elevated LFTs)   Clonidine Anaphylaxis   Sulfa Antibiotics Nausea Only   Sulfamethoxazole Nausea And Vomiting   Metoprolol    Other reaction(s): Dizziness (intolerance) syncope      Medication List    STOP taking these medications   amoxicillin-clavulanate 500-125 MG tablet Commonly known as:  AUGMENTIN   ferrous sulfate 325 (65 FE) MG tablet     TAKE these medications   amLODipine 10 MG tablet Commonly known as:  NORVASC Take 1 tablet (10 mg total) by mouth daily.   aspirin EC 81 MG tablet Take by mouth.   carvedilol 6.25 MG tablet Commonly known as:  COREG Take 1 tablet (6.25 mg total) by mouth 2 (two) times daily with a meal. What changed:  medication strength  how much to take   clopidogrel 75 MG tablet Commonly known as:  PLAVIX TAKE 1 TABLET BY MOUTH EVERY DAY   diphenhydrAMINE 25 MG tablet Commonly known as:  BENADRYL Take 25 mg by mouth at bedtime.   docusate sodium 100 MG capsule Commonly known as:  COLACE Take 100 mg by mouth as needed for mild constipation.   estradiol 0.1 MG/GM vaginal cream Commonly known as:  ESTRACE Place 1 Applicatorful vaginally 3 (three) times a week.   feeding supplement (ENSURE ENLIVE) Liqd Take 237 mLs by mouth 2 (two) times daily between meals.   hydrALAZINE 100 MG tablet Commonly known as:  APRESOLINE Take 1 tablet (100 mg total) by mouth 3 (three) times daily. What changed:    how much to take  when to take this    hydrochlorothiazide 25 MG tablet Commonly known as:  HYDRODIURIL Take 1 tablet (25 mg total) by mouth daily.   hydrocortisone cream 1 % Apply topically 3 (three) times daily as needed for itching.   ketoconazole 2 % shampoo Commonly known as:  NIZORAL Apply 1 application topically 3 (three) times a week. Use three times weekly for two weeks.   levothyroxine 125 MCG tablet Commonly known as:  SYNTHROID, LEVOTHROID Take 1 tablet (125 mcg total) by mouth daily before breakfast. Start taking on:  02/22/2018 What changed:    medication strength  how much to take   multivitamin with minerals Tabs tablet Take 1 tablet by mouth daily. Start taking on:  02/22/2018   ondansetron 8 MG tablet Commonly known as:  ZOFRAN Take 8 mg by mouth as needed for nausea/vomiting.   OTEZLA PO Take 30 mg by mouth daily.   pantoprazole 40 MG tablet Commonly known as:  PROTONIX Take 1 tablet (40 mg total) by mouth daily. Start taking on:  02/22/2018   polyethylene glycol packet Commonly known as:  MIRALAX / GLYCOLAX Take 17 g by mouth daily. What changed:    when to take this  reasons to take this   pravastatin 40 MG tablet Commonly known as:  PRAVACHOL Take 1 tablet (40 mg total) by mouth daily. HOLD UNTIL Re-evaluated by PCP and LFT's Normalize Start taking on:  02/28/2018 What changed:    additional instructions  These instructions start on 02/28/2018. If you are unsure what to do until then, ask your doctor or other care provider.   saccharomyces boulardii 250 MG capsule Commonly known as:  FLORASTOR Take 1 capsule (250 mg total) by mouth 2 (two) times daily.   scopolamine 1 MG/3DAYS Commonly known as:  TRANSDERM-SCOP Place 1 patch (1.5 mg total) onto the skin every 3 (three) days. Start taking on:  02/22/2018       Allergies  Allergen Reactions  . Amoxicillin     Other reaction(s): Other (See Comments) Drug-induced hepatitis (elevated LFTs)   . Clonidine  Anaphylaxis  . Sulfa Antibiotics Nausea Only  . Sulfamethoxazole Nausea And Vomiting  . Metoprolol     Other reaction(s): Dizziness (intolerance) syncope   Consultations:  Gastroenterology  Procedures/Studies: Mr 3d Recon At Scanner  Result Date: 02/19/2018 CLINICAL DATA:  Elevated liver function tests. Intractable nausea and vomiting. Borderline biliary ductal dilatation on recent ultrasound. EXAM: MRI ABDOMEN WITHOUT AND WITH CONTRAST (INCLUDING MRCP) TECHNIQUE: Multiplanar multisequence MR imaging of the abdomen was performed both before and after the administration of intravenous contrast. Heavily T2-weighted images of the biliary and pancreatic ducts were obtained, and three-dimensional MRCP images were rendered by  post processing. CONTRAST:  5 mL Gadavist COMPARISON:  Ultrasound on 02/18/2018 and prior CT and MRI in June 2016 FINDINGS: Lower chest: No acute findings. Hepatobiliary: No hepatic masses identified. Prior cholecystectomy. Mild diffuse biliary ductal dilatation with common bile duct measuring 11 mm. This is similar to previous CT on 11/17/2014. No evidence of choledocholithiasis or other obstructing etiology. Pancreas: A 2.4 x 2.2 cm simple appearing cystic lesion is seen in the pancreatic tail, and 2 other cysts are seen in the pancreatic tail measuring 8 mm and the pancreatic body measuring 9 mm. These are all stable compared to previous studies dating back to June 2016. There is no evidence of main pancreatic ductal dilatation or pancreas divisum. Spleen:  Within normal limits in size and appearance. Adrenals/Urinary Tract: Chronic diffuse right renal parenchymal atrophy is again demonstrated. There is persistent severe right hydronephrosis, which is new since prior MRI in March 2019. Transition zone is seen in the proximal right ureter. Urothelial neoplasm cannot be excluded. An enhancing mass is again seen in the anterior midpole the left kidney which measures 2.1 x 1.8 cm. This  is new since 2016, and consistent with renal cell carcinoma. Stomach/Bowel: Visualized portion unremarkable. Vascular/Lymphatic: No pathologically enlarged lymph nodes identified. No abdominal aortic aneurysm. Other:  None. Musculoskeletal:  No suspicious bone lesions identified. IMPRESSION: Prior cholecystectomy. Mild biliary ductal dilatation likely related to prior cholecystectomy. No choledocholithiasis or other obstructing etiology visualized. Several simple appearing cystic lesions in the pancreatic body and tail, largest measuring 2.4 cm, remain stable since 2016. Recommend continued follow-up by MRI in 1 year. This recommendation follows ACR consensus guidelines: Management of Incidental Pancreatic Cysts: A White Paper of the ACR Incidental Findings Committee. J Am Coll Radiol 8338;25:053-976. 2 cm solid enhancing mass in the left kidney is consistent with renal cell carcinoma. Severe right hydronephrosis again seen with transition point in the proximal right ureter. Urothelial neoplasm cannot be excluded. Recommend urology referral for management. Electronically Signed   By: Earle Gell M.D.   On: 02/19/2018 08:16   Mr Abdomen Mrcp W Wo Contast  Result Date: 02/19/2018 CLINICAL DATA:  Elevated liver function tests. Intractable nausea and vomiting. Borderline biliary ductal dilatation on recent ultrasound. EXAM: MRI ABDOMEN WITHOUT AND WITH CONTRAST (INCLUDING MRCP) TECHNIQUE: Multiplanar multisequence MR imaging of the abdomen was performed both before and after the administration of intravenous contrast. Heavily T2-weighted images of the biliary and pancreatic ducts were obtained, and three-dimensional MRCP images were rendered by post processing. CONTRAST:  5 mL Gadavist COMPARISON:  Ultrasound on 02/18/2018 and prior CT and MRI in June 2016 FINDINGS: Lower chest: No acute findings. Hepatobiliary: No hepatic masses identified. Prior cholecystectomy. Mild diffuse biliary ductal dilatation with common  bile duct measuring 11 mm. This is similar to previous CT on 11/17/2014. No evidence of choledocholithiasis or other obstructing etiology. Pancreas: A 2.4 x 2.2 cm simple appearing cystic lesion is seen in the pancreatic tail, and 2 other cysts are seen in the pancreatic tail measuring 8 mm and the pancreatic body measuring 9 mm. These are all stable compared to previous studies dating back to June 2016. There is no evidence of main pancreatic ductal dilatation or pancreas divisum. Spleen:  Within normal limits in size and appearance. Adrenals/Urinary Tract: Chronic diffuse right renal parenchymal atrophy is again demonstrated. There is persistent severe right hydronephrosis, which is new since prior MRI in March 2019. Transition zone is seen in the proximal right ureter. Urothelial neoplasm cannot be excluded. An enhancing  mass is again seen in the anterior midpole the left kidney which measures 2.1 x 1.8 cm. This is new since 2016, and consistent with renal cell carcinoma. Stomach/Bowel: Visualized portion unremarkable. Vascular/Lymphatic: No pathologically enlarged lymph nodes identified. No abdominal aortic aneurysm. Other:  None. Musculoskeletal:  No suspicious bone lesions identified. IMPRESSION: Prior cholecystectomy. Mild biliary ductal dilatation likely related to prior cholecystectomy. No choledocholithiasis or other obstructing etiology visualized. Several simple appearing cystic lesions in the pancreatic body and tail, largest measuring 2.4 cm, remain stable since 2016. Recommend continued follow-up by MRI in 1 year. This recommendation follows ACR consensus guidelines: Management of Incidental Pancreatic Cysts: A White Paper of the ACR Incidental Findings Committee. J Am Coll Radiol 2119;41:740-814. 2 cm solid enhancing mass in the left kidney is consistent with renal cell carcinoma. Severe right hydronephrosis again seen with transition point in the proximal right ureter. Urothelial neoplasm cannot be  excluded. Recommend urology referral for management. Electronically Signed   By: Earle Gell M.D.   On: 02/19/2018 08:16   Ct Renal Stone Study  Result Date: 02/17/2018 CLINICAL DATA:  Nausea for the past 2 years, worsening. Vomiting this morning. EXAM: CT ABDOMEN AND PELVIS WITHOUT CONTRAST TECHNIQUE: Multidetector CT imaging of the abdomen and pelvis was performed following the standard protocol without IV contrast. COMPARISON:  12/20/2017.  Abdomen MRI dated 07/17/2017. FINDINGS: Lower chest: Stable mild scarring at the left lung base. Small to moderate sized pericardial effusion with an interval increase in size, currently measuring 11 mm in thickness and 6 Hounsfield units in density. Hepatobiliary: No focal liver abnormality is seen. Status post cholecystectomy. No biliary dilatation. Pancreas: Diffusely atrophied. The previously demonstrated 2.6 x 2.2 cm pancreatic tail mass currently measures 2.6 x 2.3 cm on image number 29 series 2. The previously demonstrated 2nd, smaller pancreatic tail mass is not clearly visible today without intravenous contrast. Spleen: Normal in size without focal abnormality. Adrenals/Urinary Tract: Stable severely atrophied right kidney with marked hydronephrosis. The previously demonstrated 1.8 cm anterior left renal mass is not well visualized without intravenous contrast, grossly unchanged. Unremarkable bladder and ureters. Stomach/Bowel: Mild sigmoid colon diverticulosis. Surgically absent appendix. Unremarkable stomach and small bowel. Vascular/Lymphatic: Atheromatous arterial calcifications without aneurysm. No enlarged lymph nodes. Celiac axis, superior mesenteric and left renal artery stents. Both the celiac axis and SMA stents have a crimped appearance, with marked narrowing of the SMA stent on the sagittal images and probable moderate narrowing of the celiac axis stent. Reproductive: Status post hysterectomy. No adnexal masses. Other: No abdominal wall hernia or  abnormality. No abdominopelvic ascites. Upper abdominal surgical clips on the right. Musculoskeletal: Moderate levoconvex thoracolumbar scoliosis and degenerative changes. IMPRESSION: 1. No acute abnormality. 2. Small to moderate-sized pericardial effusion with an interval increase in size. 3. Stable pancreatic tail cyst, currently measuring 2.6 x 2.3 cm. 4. 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 5. Grossly stable left renal mass, incompletely evaluated without intravenous contrast. 6. Crimped appearance of the celiac axis and SMA stents with luminal narrowing, as described above. Electronically Signed   By: Claudie Revering M.D.   On: 02/17/2018 11:24   US Abdomen Limited Ruq  Result Date: 02/18/2018 CLINICAL DATA:  Intractable nausea, vomiting EXAM: ULTRASOUND ABDOMEN LIMITED RIGHT UPPER QUADRANT COMPARISON:  02/17/2018 CT FINDINGS: Gallbladder: Prior cholecystectomy Common bile duct: Diameter: Dilated, 10 mm, likely related to post cholecystectomy state. Liver: No focal lesion identified. Within normal limits in parenchymal echogenicity. Portal vein is  patent on color Doppler imaging with normal direction of blood flow towards the liver. Incidentally noted is severe right hydronephrosis as seen on CT. IMPRESSION: Prior cholecystectomy. Mildly prominent common bile duct likely related to patient's age and post cholecystectomy state. Severe right hydronephrosis as seen on CT. Electronically Signed   By: Rolm Baptise M.D.   On: 02/18/2018 13:55   ECHOCARDIOGRAM on 02/18/18 ------------------------------------------------------------------- Study Conclusions  - Procedure narrative: Transthoracic echocardiography. Image   quality was poor. The study was technically difficult, as a   result of poor sound wave transmission. - Left ventricle: The cavity size was normal. Systolic function was   normal. The estimated ejection fraction was in  the range of 55%   to 60%. Wall motion was normal; there were no regional wall   motion abnormalities. Doppler parameters are consistent with   abnormal left ventricular relaxation (grade 1 diastolic   dysfunction). - Aortic valve: Mildly calcified annulus. Trileaflet; moderately   thickened, moderately calcified leaflets. - Mitral valve: There was mild regurgitation. - Tricuspid valve: There was mild regurgitation. - Pericardium, extracardiac: A small pericardial effusion was   identified circumferential to the heart. There was no evidence of   hemodynamic compromise.  Impressions:  - Compared to the prior study, there has been no significant   interval change.  Subjective: Seen and examined and states that she is feeling much better and nausea is almost completely resolved.  States that the scopolamine patch has helped.  Denies chest pain, lightheadedness, dizziness or nausea today.  Ready to go home and states that [place]  Discharge Exam: Vitals:   02/21/18 0513 02/21/18 0901  BP: (!) 169/61 (!) 170/62  Pulse: 74   Resp: 16   Temp: 98.8 F (37.1 C)   SpO2: 95%    Vitals:   02/20/18 2031 02/20/18 2035 02/21/18 0513 02/21/18 0901  BP: (!) 190/96 (!) 196/60 (!) 169/61 (!) 170/62  Pulse: 79  74   Resp: 14  16   Temp: 97.8 F (36.6 C)  98.8 F (37.1 C)   TempSrc: Oral  Oral   SpO2: 96%  95%   Weight:      Height:       General: Pt is alert, awake, not in acute distress Cardiovascular: RRR, S1/S2 +, no rubs, no gallops Respiratory: Diminished bilaterally, no wheezing, no rhonchi Abdominal: Soft, NT, ND, bowel sounds + Extremities: no edema, no cyanosis  The results of significant diagnostics from this hospitalization (including imaging, microbiology, ancillary and laboratory) are listed below for reference.    Microbiology: Recent Results (from the past 240 hour(s))  Culture, Urine     Status: None   Collection  Time: 02/18/18 12:53 PM  Result Value Ref Range Status   Specimen Description   Final    URINE, RANDOM Performed at [facility] [address], [place], [place] [address]    Special Requests   Final    NONE Performed at [facility], [place] [address] [place], [place] [address]    Culture   Final    NO GROWTH Performed at [facility], [place] [address], [place],  [address]    Report Status 02/19/2018 FINAL  Final    Labs: BNP (last 3 results) No results for input(s): BNP in the last 8760 hours. Basic Metabolic Panel: Recent Labs  Lab 02/17/18 1041 02/18/18 0525 02/19/18 0501 02/20/18 0536 02/21/18 9242  NA 136 140 136 139 137  K 4.2 3.9 4.2 3.7 4.1  CL 100 105 106 106 105  CO2 24 26 23 23 23   GLUCOSE 108* 92 108* 96 87  BUN 20 21 12 10 14   CREATININE 1.01* 1.03* 0.82 0.79 0.98  CALCIUM 9.3 9.1 8.6* 8.6* 8.7*   Liver Function Tests: Recent Labs  Lab 02/17/18 1041 02/18/18 0525 02/19/18 0501 02/20/18 0536 02/21/18 0527  AST 242* 281* 287* 141* 87*  ALT 239* 246* 282* 196* 145*  ALKPHOS 195* 210* 219* 199* 190*  BILITOT 0.6 0.6 0.5 0.3 0.3  PROT 7.0 6.1* 6.0* 5.7* 6.2*  ALBUMIN 3.7 3.4* 3.1* 2.9* 3.1*   Recent Labs  Lab 02/17/18 1041  LIPASE 23   No results for input(s): AMMONIA in the last 168 hours. CBC: Recent Labs  Lab 02/17/18 1041 02/21/18 0527  WBC 7.5 6.2  NEUTROABS  --  3.2  HGB 11.9* 10.3*  HCT 36.5 32.6*  MCV 94.8 97.6  PLT 159 121*   Cardiac Enzymes: No results for input(s): CKTOTAL, CKMB, CKMBINDEX, TROPONINI in the last 168 hours. BNP: Invalid input(s): POCBNP CBG: No results for input(s): GLUCAP in the last 168 hours. D-Dimer No results for input(s): DDIMER in the last 72 hours. Hgb A1c No results for input(s): HGBA1C in the last 72 hours. Lipid Profile No results for input(s): CHOL, HDL, LDLCALC, TRIG, CHOLHDL, LDLDIRECT in the last 72 hours. Thyroid function studies Recent  Labs    02/19/18 0501 02/19/18 1459  TSH 29.435*  --   T3FREE  --  1.8*   Anemia work up No results for input(s): VITAMINB12, FOLATE, FERRITIN, TIBC, IRON, RETICCTPCT in the last 72 hours. Urinalysis    Component Value Date/Time   COLORURINE YELLOW 02/17/2018 1024   APPEARANCEUR CLEAR 02/17/2018 1024   LABSPEC 1.025 02/17/2018 1024   PHURINE 6.0 02/17/2018 1024   GLUCOSEU NEGATIVE 02/17/2018 1024   HGBUR NEGATIVE 02/17/2018 1024   BILIRUBINUR SMALL (A) 02/17/2018 1024   KETONESUR 15 (A) 02/17/2018 1024   PROTEINUR 100 (A) 02/17/2018 1024   NITRITE NEGATIVE 02/17/2018 1024   LEUKOCYTESUR NEGATIVE 02/17/2018 1024   Sepsis Labs Invalid input(s): PROCALCITONIN,  WBC,  LACTICIDVEN Microbiology Recent Results (from the past 240 hour(s))  Culture, Urine     Status: None   Collection Time: 02/18/18 12:53 PM  Result Value Ref Range Status   Specimen Description   Final    URINE, RANDOM Performed at [facility], [place] [address] [address] [place], [place] [address]    Special Requests   Final    NONE Performed at [facility], [place] [address] [place], [place] [address]    Culture   Final    NO GROWTH Performed at [facility], [place] [address] [place], [place] [address]    Report Status 02/19/2018 FINAL  Final   Time coordinating discharge: 35 minutes  SIGNED:  Kerney Elbe, DO [facility] 02/21/2018, 5:04 PM Pager is on [place]  If 7PM-7AM, please contact night-coverage [email or web address] Password [email or web address]

## 2018-02-21 NOTE — Care Management Important Message (Signed)
Important Message  Patient Details  Name: NAZIYA HEGWOOD MRN: [id number] Date of Birth: 07-08-1930   Medicare Important Message Given:  Yes    Kerin Salen 02/21/2018, 10:24 AMImportant Message  Patient Details  Name: SARAFINA PUTHOFF MRN: [id number] Date of Birth: Nov 18, 1930   Medicare Important Message Given:  Yes    Kerin Salen 02/21/2018, 10:24 AM

## 2018-02-21 NOTE — Consult Note (Signed)
[facility] Nurse wound consult note Reason for Consult: Stage 2 pressure injury to bilateral buttocks.  Rapid, recent weight loss, nausea and vomiting, UTI and dehydration Wound type:pressure and moisture Pressure Injury POA: Yes Measurement:1 cm x 1 cm x 0.1 cm in gluteal fold Wound XIH:WTUU and moist Drainage (amount, consistency, odor) scant weeping Periwound:intact Dressing procedure/placement/frequency: Keep skin clean and dry.  Barrier cream twice daily and PRN soilage.  Will not follow at this time.  Please re-consult if needed.  Domenic Moras MSN, RN, FNP-BC CWON Wound, Ostomy, Continence Nurse Pager [phone]

## 2018-02-22 LAB — PROTEIN ELECTROPHORESIS, SERUM
A/G Ratio: 1.1 (ref 0.7–1.7)
ALPHA-1-GLOBULIN: 0.2 g/dL (ref 0.0–0.4)
Albumin ELP: 2.8 g/dL — ABNORMAL LOW (ref 2.9–4.4)
Alpha-2-Globulin: 0.8 g/dL (ref 0.4–1.0)
Beta Globulin: 0.7 g/dL (ref 0.7–1.3)
Gamma Globulin: 0.7 g/dL (ref 0.4–1.8)
Globulin, Total: 2.5 g/dL (ref 2.2–3.9)
TOTAL PROTEIN ELP: 5.3 g/dL — AB (ref 6.0–8.5)

## 2018-02-22 LAB — ANTI-SMOOTH MUSCLE ANTIBODY, IGG: F-ACTIN AB IGG: 9 U (ref 0–19)

## 2018-02-22 LAB — ALPHA-1-ANTITRYPSIN: A-1 Antitrypsin, Ser: 162 mg/dL (ref 90–200)

## 2018-02-22 LAB — MITOCHONDRIAL ANTIBODIES: Mitochondrial M2 Ab, IgG: <20 Units (ref 0.0–20.0)

## 2018-02-22 LAB — ANTINUCLEAR ANTIBODIES, IFA: ANTINUCLEAR ANTIBODIES, IFA: NEGATIVE

## 2018-02-23 LAB — FUNGUS CULTURE WITH STAIN

## 2018-02-23 LAB — FUNGUS CULTURE RESULT

## 2018-02-23 LAB — FUNGAL ORGANISM REFLEX

## 2018-03-09 ENCOUNTER — Encounter: Payer: Self-pay | Admitting: Family Medicine

## 2018-03-09 ENCOUNTER — Ambulatory Visit (INDEPENDENT_AMBULATORY_CARE_PROVIDER_SITE_OTHER): Payer: Medicare Other | Admitting: Family Medicine

## 2018-03-09 VITALS — BP 170/60 | HR 90 | Temp 98.3°F | Wt 109.1 lb

## 2018-03-09 DIAGNOSIS — H6122 Impacted cerumen, left ear: Secondary | ICD-10-CM

## 2018-03-09 DIAGNOSIS — K219 Gastro-esophageal reflux disease without esophagitis: Secondary | ICD-10-CM

## 2018-03-09 DIAGNOSIS — R0981 Nasal congestion: Secondary | ICD-10-CM

## 2018-03-09 DIAGNOSIS — R110 Nausea: Secondary | ICD-10-CM

## 2018-03-09 DIAGNOSIS — H6982 Other specified disorders of Eustachian tube, left ear: Secondary | ICD-10-CM

## 2018-03-09 MED ORDER — RANITIDINE HCL 150 MG PO TABS: 150.0000 mg | ORAL_TABLET | Freq: Two times a day (BID) | ORAL | 2 refills | Status: AC

## 2018-03-09 MED ORDER — FLUTICASONE PROPIONATE 50 MCG/ACT NA SUSP: 2.0000 | Freq: Every day | NASAL | 6 refills | Status: AC

## 2018-03-09 MED ORDER — PROMETHAZINE HCL 25 MG PO TABS: 12.5000 mg | ORAL_TABLET | Freq: Two times a day (BID) | ORAL | 1 refills | Status: AC | PRN

## 2018-03-09 NOTE — Progress Notes (Signed)
Brooke Walker DOB: 1930/12/04 Encounter date: 03/09/2018  This is a 84 y.o. female who presents with Chief Complaint  Patient presents with  . Ear Pain    left ear, "sharp pain every onece in a while" wants referral to ENT    History of present illness:  Has not happened to her before; does occasionally need ears cleaned out.   Has had extreme nausea for 2.5 years. Early in mornings will blow nose with congestion; no cold/no pain (except ear). Has endoscopy planned for mid November with GI. Trying to get this moved up.    Has not had any benefit with zofran. Does vomit a few times/week and has since the nausea started. Doesn't want to eat because it makes her more nauseated. Vomiting has improved since recent hospitalization/ER.  Allergies  Allergen Reactions  . Amoxicillin     Other reaction(s): Other (See Comments) Drug-induced hepatitis (elevated LFTs)   . Clonidine Anaphylaxis  . Sulfa Antibiotics Nausea Only  . Sulfamethoxazole Nausea And Vomiting  . Metoprolol     Other reaction(s): Dizziness (intolerance) syncope   Current Meds  Medication Sig  . amLODipine (NORVASC) 10 MG tablet Take 1 tablet (10 mg total) by mouth daily.  Marland Kitchen Apremilast (OTEZLA PO) Take 30 mg by mouth daily.   Marland Kitchen aspirin EC 81 MG tablet Take by mouth.  . carvedilol (COREG) 6.25 MG tablet Take 1 tablet (6.25 mg total) by mouth 2 (two) times daily with a meal.  . clopidogrel (PLAVIX) 75 MG tablet TAKE 1 TABLET BY MOUTH EVERY DAY  . diphenhydrAMINE (BENADRYL) 25 MG tablet Take 25 mg by mouth at bedtime.  . docusate sodium (COLACE) 100 MG capsule Take 100 mg by mouth as needed for mild constipation.   Marland Kitchen estradiol (ESTRACE) 0.1 MG/GM vaginal cream Place 1 Applicatorful vaginally 3 (three) times a week.   . feeding supplement, ENSURE ENLIVE, (ENSURE ENLIVE) LIQD Take 237 mLs by mouth 2 (two) times daily between meals.  . hydrALAZINE (APRESOLINE) 100 MG tablet Take 1 tablet (100 mg total) by mouth 3 (three)  times daily. (Patient taking differently: Take 50 mg by mouth 2 (two) times daily. )  . hydrochlorothiazide (HYDRODIURIL) 25 MG tablet Take 1 tablet (25 mg total) by mouth daily.  . hydrocortisone cream 1 % Apply topically 3 (three) times daily as needed for itching.  Marland Kitchen ketoconazole (NIZORAL) 2 % shampoo Apply 1 application topically 3 (three) times a week. Use three times weekly for two weeks.  Marland Kitchen levothyroxine (SYNTHROID, LEVOTHROID) 125 MCG tablet Take 1 tablet (125 mcg total) by mouth daily before breakfast.  . pantoprazole (PROTONIX) 40 MG tablet Take 1 tablet (40 mg total) by mouth daily.  . polyethylene glycol (MIRALAX / GLYCOLAX) packet Take 17 g by mouth daily. (Patient taking differently: Take 17 g by mouth daily as needed for mild constipation. )  . pravastatin (PRAVACHOL) 40 MG tablet Take 1 tablet (40 mg total) by mouth daily. HOLD UNTIL Re-evaluated by PCP and LFT's Normalize  . saccharomyces boulardii (FLORASTOR) 250 MG capsule Take 1 capsule (250 mg total) by mouth 2 (two) times daily.  Marland Kitchen scopolamine (TRANSDERM-SCOP) 1 MG/3DAYS Place 1 patch (1.5 mg total) onto the skin every 3 (three) days.  . [DISCONTINUED] Multiple Vitamin (MULTIVITAMIN WITH MINERALS) TABS tablet Take 1 tablet by mouth daily.    Review of Systems  Constitutional: Negative for chills, fatigue and fever.  Respiratory: Negative for cough, chest tightness, shortness of breath and wheezing.   Cardiovascular: Negative  for chest pain, palpitations and leg swelling.  Gastrointestinal: Positive for nausea and vomiting. Negative for abdominal distention and abdominal pain.    Objective:  BP (!) 170/60 (BP Location: Left Arm, Patient Position: Sitting, Cuff Size: Normal)   Pulse 90   Temp 98.3 F (36.8 C) (Oral)   Wt 109 lb 1.6 oz (49.5 kg)   SpO2 90%   BMI 18.73 kg/m   Weight: 109 lb 1.6 oz (49.5 kg)   BP Readings from Last 3 Encounters:  03/09/18 (!) 170/60  02/21/18 (!) 170/62  02/15/18 (!) 190/60   Wt  Readings from Last 3 Encounters:  03/09/18 109 lb 1.6 oz (49.5 kg)  02/17/18 110 lb 7 oz (50.1 kg)  02/15/18 114 lb 9.6 oz (52 kg)    Physical Exam  Constitutional: She is oriented to person, place, and time. She appears well-developed and well-nourished. No distress.  HENT:  Right Ear: Tympanic membrane, external ear and ear canal normal. Tympanic membrane is not erythematous. No middle ear effusion.  Left Ear: Tympanic membrane, external ear and ear canal normal. Tympanic membrane is not erythematous.  No middle ear effusion.  Cerumen plug present right canal; removed with lighted curette. Cerumen obstructing view left TM; removed with lighted curette. Patient tolerated procedure well. No current pain in ear.   Cardiovascular: Normal rate, regular rhythm and normal heart sounds. Exam reveals no friction rub.  No murmur heard. No lower extremity edema  Pulmonary/Chest: Effort normal and breath sounds normal. No respiratory distress. She has no wheezes. She has no rales.  Neurological: She is alert and oriented to person, place, and time.  Psychiatric: She has a normal mood and affect. Her behavior is normal. Cognition and memory are normal.    Assessment/Plan 1. Nausea Ongoing. Does have upcoming GI appointment which is helpful and with endoscopy. She is trying to get earlier appointment. Trial phenergan in meanwhile. Weight is decreasing which is concerning. - promethazine (PHENERGAN) 25 MG tablet; Take 0.5-1 tablets (12.5-25 mg total) by mouth 2 (two) times daily as needed for nausea or vomiting.  Dispense: 90 tablet; Refill: 1  2. Eustachian tube dysfunction, left Suspect some ETD. Trial flonase. If worsening ok for referral to ENt but she is happy to hold off for now since impaction may have been contributing.   3. Impacted cerumen of left ear Resolved in office.  4. Nasal congestion  - fluticasone (FLONASE) 50 MCG/ACT nasal spray; Place 2 sprays into both nostrils daily.   Dispense: 16 g; Refill: 6  5. Gastroesophageal reflux disease, esophagitis presence not specified Has appt with GI; reflux has been worse. She did get benefit from zantac in past so I have written to take in addition to omeprazole.  - ranitidine (ZANTAC) 150 MG tablet; Take 1 tablet (150 mg total) by mouth 2 (two) times daily.  Dispense: 60 tablet; Refill: 2    Return in about 3 months (around 06/09/2018) for Chronic condition visit.    Micheline Rough, MD

## 2018-03-09 NOTE — Patient Instructions (Signed)
Trial of flonase nasal spray to help with ear pain/congestion.  Chin down when using nasal spray and aim for ear on same side when squirting. Small inhalation (too large will make it go down your throat). If nose gets dry you can use vasoline on qtip for moisturizing.  Phenergan can cause drowsiness; use with caution. Keep follow up with GI.

## 2018-04-15 DEATH — deceased

## 2018-05-16 DEATH — deceased

## 2018-05-17 ENCOUNTER — Telehealth: Payer: Self-pay

## 2018-05-17 NOTE — Telephone Encounter (Signed)
Copied from [place] [id number]. Topic: General - Deceased Patient >> 09-Jun-2018  8:41 AM Ahmed Prima L wrote: Reason for CRM: Patient passed away on 2018/05/07 @ [facility].   Route to department's [facility] Pool.

## 2018-05-21 ENCOUNTER — Ambulatory Visit: Payer: Medicare Other | Admitting: Family Medicine

## 2019-05-17 IMAGING — CT CT ABD-PELV W/ CM
2 of 5 series · 16 of 46 positions shown, 18 images · IV contrast (APPLIED)
Comparison: None

CLINICAL DATA: Nausea, vomiting, generalized acute abdominal pain,
leukocytosis, history RIGHT adrenal gland cancer, chronic kidney
disease, coronary artery disease

EXAM:
CT ABDOMEN AND PELVIS WITH CONTRAST
TECHNIQUE: Multidetector CT imaging of the abdomen and pelvis was performed
using the standard protocol following bolus administration of
intravenous contrast. Sagittal and coronal MPR images reconstructed
from axial data set.
CONTRAST:  75mL MLONN6-UJJ IOPAMIDOL (MLONN6-UJJ) INJECTION 61% IV.
No oral contrast.

[Series 2: axial st · axial · 0.70mm/px · z∈[+569,+939]mm · 13 of 84 slices shown, 15 images]
[im 5/84  soft-tissue]
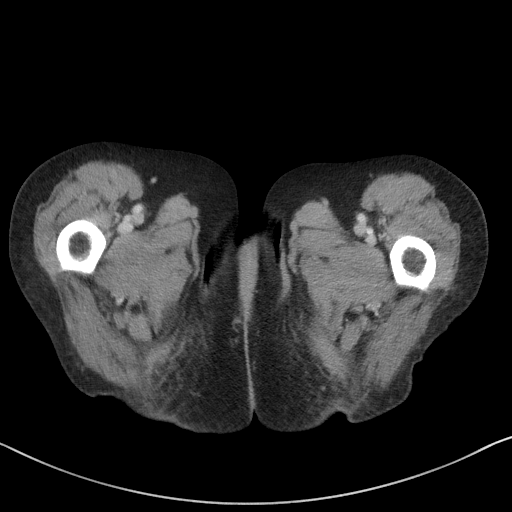
[im 5/84  bone]
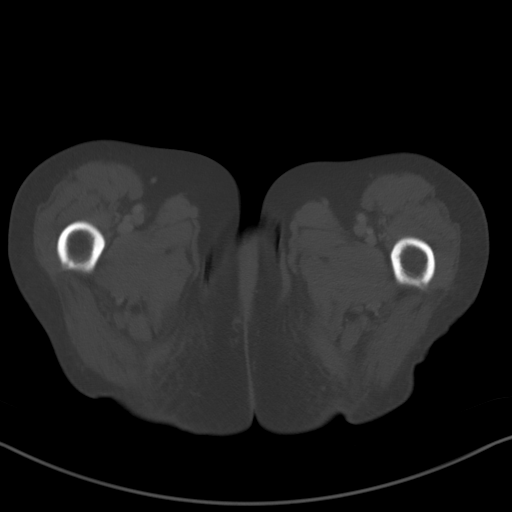
[im 13/84  soft-tissue]
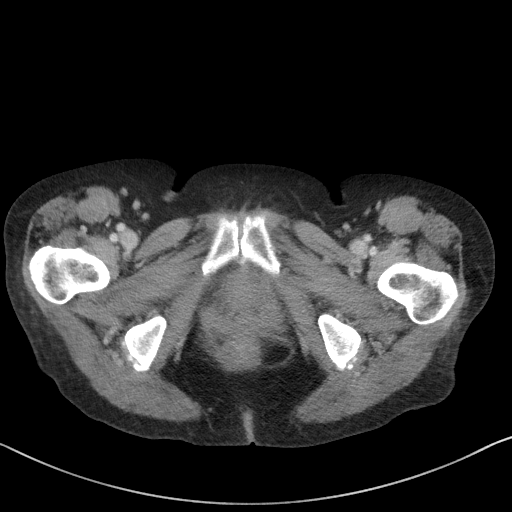
[im 17/84  soft-tissue]
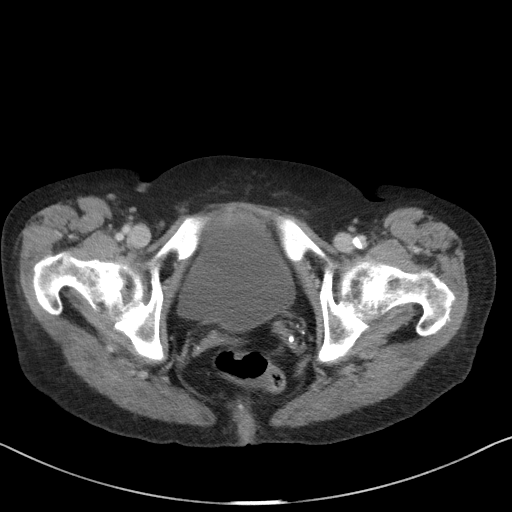
[im 25/84  soft-tissue]
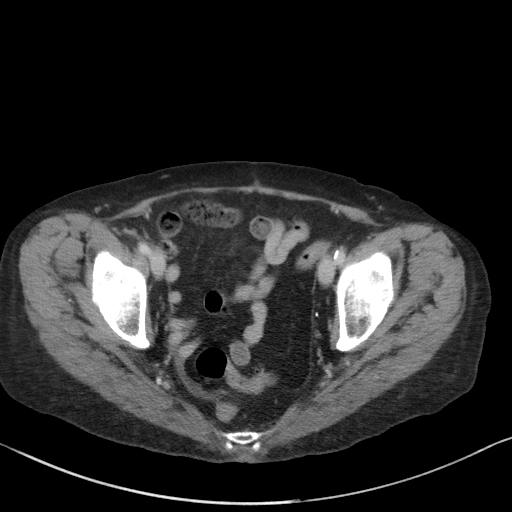
[im 30/84  soft-tissue]
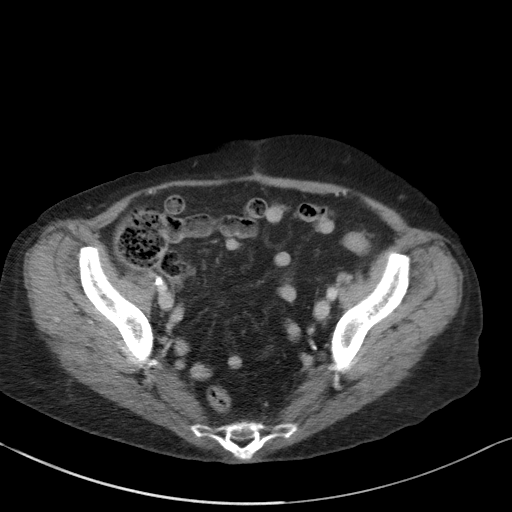
[im 38/84  soft-tissue]
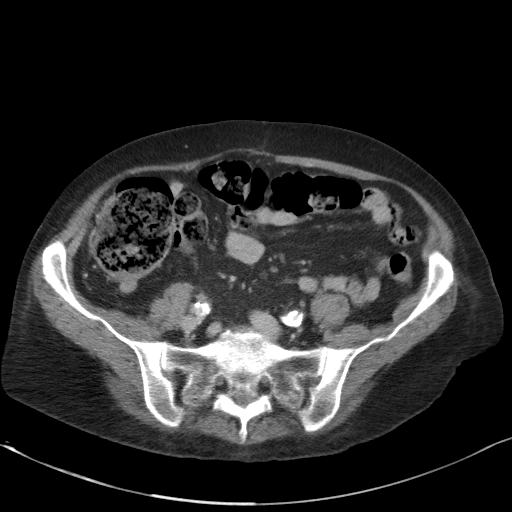
[im 42/84  soft-tissue]
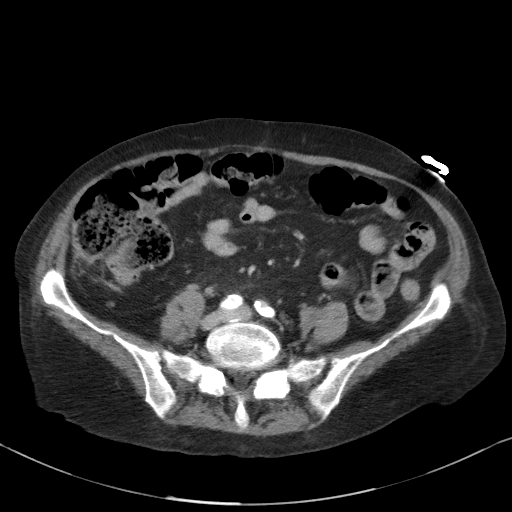
[im 46/84  soft-tissue]
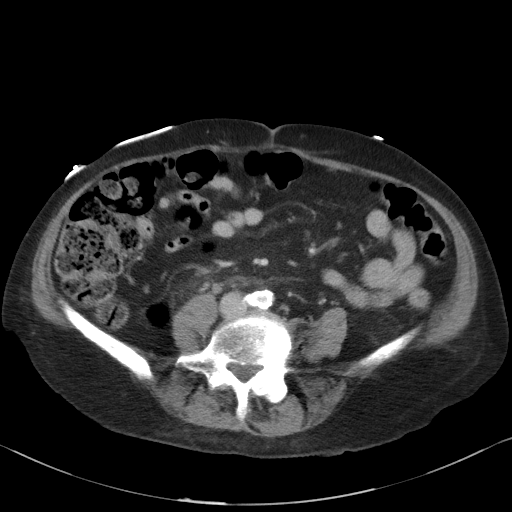
[im 54/84  soft-tissue]
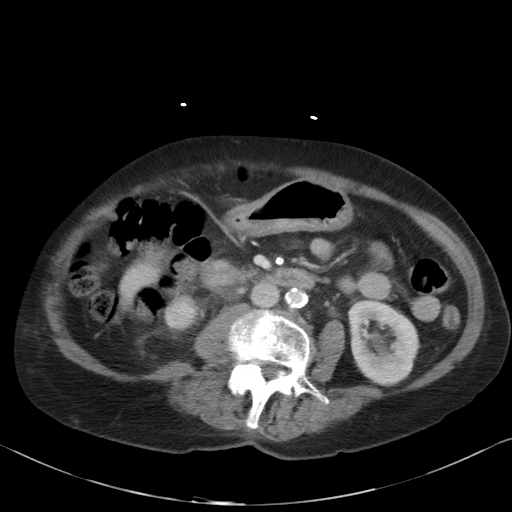
[im 54/84  bone]
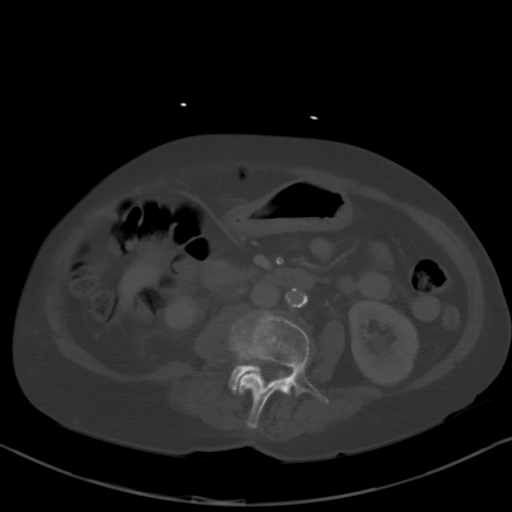
[im 59/84  soft-tissue]
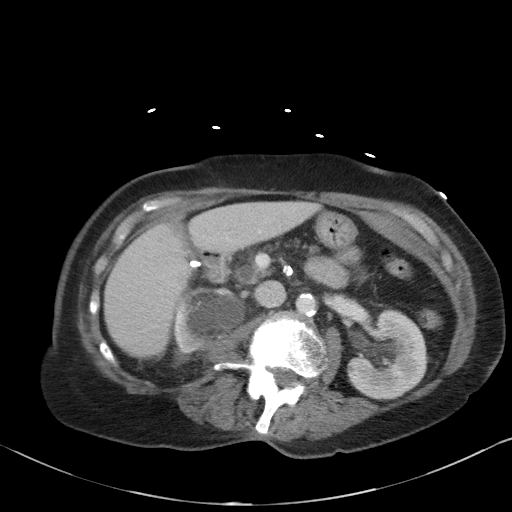
[im 67/84  soft-tissue]
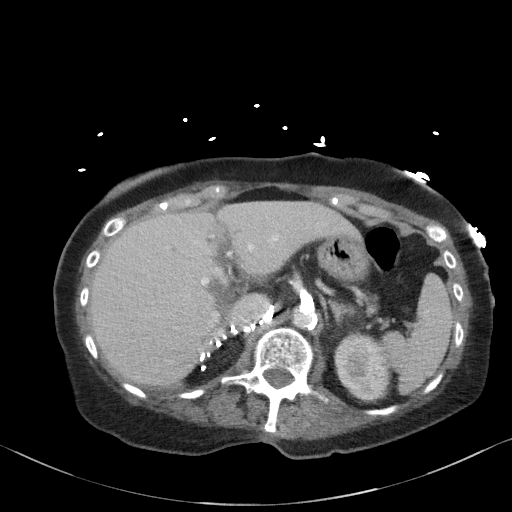
[im 71/84  soft-tissue]
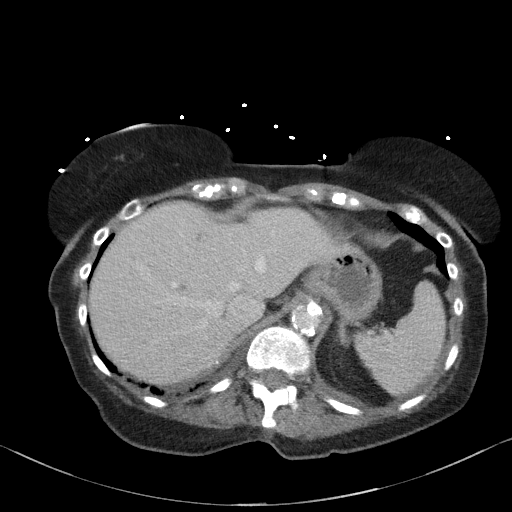
[im 79/84  soft-tissue]
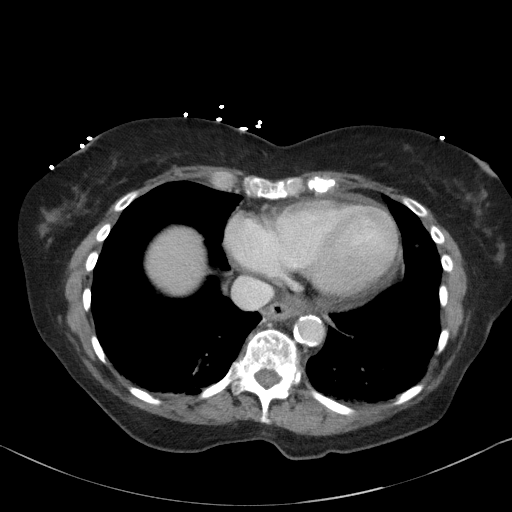

[Series 5: coronal st · coronal · 0.79mm/px · 3 of 98 slices shown]
[im 33/98  soft-tissue]
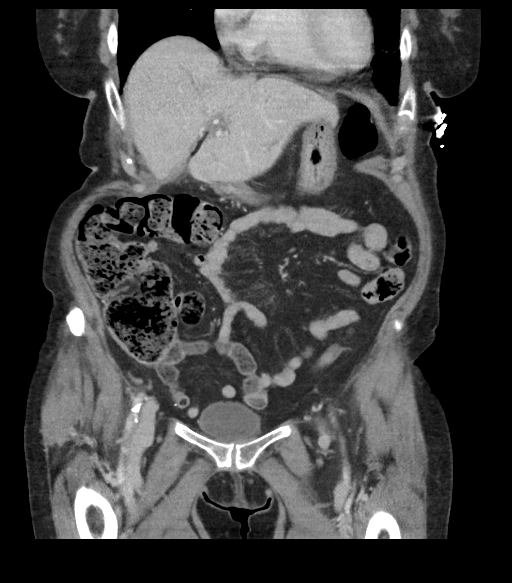
[im 44/98  soft-tissue]
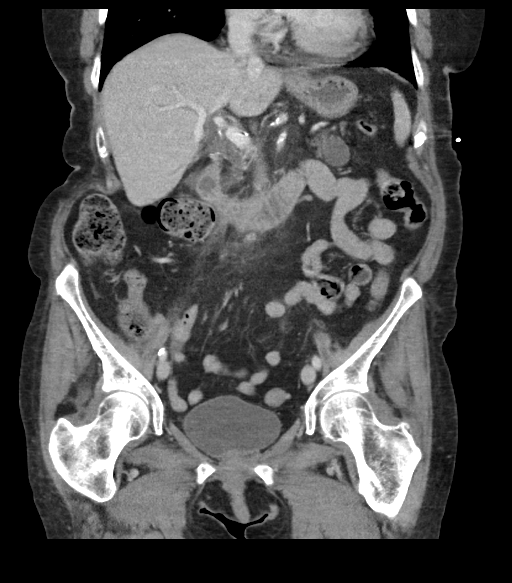
[im 54/98  soft-tissue]
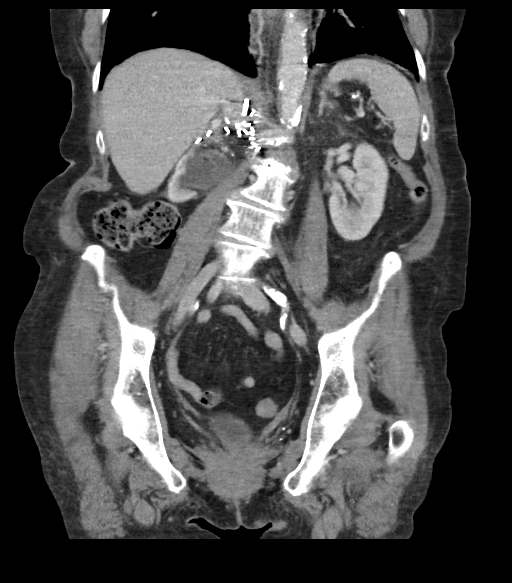

[16 of 46 positions shown; findings below may reference images not displayed]

FINDINGS: Lower chest: Mild bibasilar atelectasis

Hepatobiliary: Gallbladder surgically absent. Mild intrahepatic and
extrahepatic biliary dilatation with CBD measuring 10 mm proximally
and 8 mm distally.

Pancreas: Atrophic pancreas. Cystic lesion at pancreatic tail 2.5 x
2.4 x 2.5 cm.

Spleen: Normal appearance.  Small splenule at splenic hilum

Adrenals/Urinary Tract: LEFT adrenal gland unremarkable. Prior
resection of LEFT adrenal gland. LEFT kidney normal size with small
LEFT renal cyst. Atrophic RIGHT kidney with significant
hydronephrosis and minimal proximal hydroureter though the majority
of the RIGHT ureters normal caliber, question proximal ureteral
obstruction. No definite urinary tract calcification is seen. Mild
RIGHT perinephric edema. Single focus of air within urinary bladder
question prior catheterization.

Stomach/Bowel: Stomach unremarkable. Prominent stool RIGHT colon.
Sigmoid diverticulosis. Remaining bowel loops unremarkable.

Vascular/Lymphatic: Extensive atherosclerotic calcifications of
aorta, iliac arteries and visceral artery origins. Dense
calcification at origins of celiac artery, SMA and LEFT renal
artery. Probable high-grade narrowings of the abdominal aorta and
iliac arteries as well as proximal visceral arteries.

Reproductive: Uterus surgically absent. Nonvisualization of ovaries.

Other: No free air or free fluid.  No hernia.

Musculoskeletal: Diffuse osseous demineralization. Degenerative
changes and scoliosis of lumbar spine.
IMPRESSION: Atrophic RIGHT kidney with RIGHT hydronephrosis and proximal RIGHT
hydroureter question proximal ureteral obstruction; no definite
ureteral calcification is seen.

Intrahepatic and extrahepatic biliary dilatation post
cholecystectomy recommend correlation with LFTs.

Extensive atherosclerotic calcifications with expected significant
narrowings of the abdominal aorta, iliac arteries, celiac artery,
SMA and LEFT renal artery.

Cystic lesion at pancreatic tail 2.5 x 2.5 x 2.4 cm; if clinically
indicated based on patient age and clinical status, would recommend
follow-up MR imaging in 6 months to characterize and assess
stability.
